# Patient Record
Sex: Male | Born: 1955 | Race: White | Hispanic: No | Marital: Married | Smoking: Never smoker
Health system: Southern US, Community
[De-identification: ages and names within clinical notes are randomized; demographics above are authoritative.]

## PROBLEM LIST (undated history)

## (undated) DIAGNOSIS — Z973 Presence of spectacles and contact lenses: Secondary | ICD-10-CM

## (undated) DIAGNOSIS — M1712 Unilateral primary osteoarthritis, left knee: Secondary | ICD-10-CM

## (undated) DIAGNOSIS — C801 Malignant (primary) neoplasm, unspecified: Secondary | ICD-10-CM

## (undated) DIAGNOSIS — I10 Essential (primary) hypertension: Secondary | ICD-10-CM

## (undated) DIAGNOSIS — E669 Obesity, unspecified: Secondary | ICD-10-CM

## (undated) HISTORY — DX: Unilateral primary osteoarthritis, left knee: M17.12

## (undated) HISTORY — DX: Obesity, unspecified: E66.9

## (undated) HISTORY — DX: Essential (primary) hypertension: I10

## (undated) HISTORY — PX: EYE SURGERY: SHX253

## (undated) HISTORY — PX: JOINT REPLACEMENT: SHX530

---
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
## 1982-12-19 HISTORY — PX: FINGER SURGERY: SHX640

## 1985-12-19 HISTORY — PX: KNEE ARTHROSCOPY WITH MEDIAL MENISECTOMY: SHX5651

## 1997-12-19 HISTORY — PX: CORNEAL TRANSPLANT: SHX108

## 2005-06-02 ENCOUNTER — Ambulatory Visit (HOSPITAL_COMMUNITY): Admission: RE | Admit: 2005-06-02 | Discharge: 2005-06-02 | Payer: Self-pay | Admitting: Orthopedic Surgery

## 2005-06-02 ENCOUNTER — Ambulatory Visit (HOSPITAL_BASED_OUTPATIENT_CLINIC_OR_DEPARTMENT_OTHER): Admission: RE | Admit: 2005-06-02 | Discharge: 2005-06-02 | Payer: Self-pay | Admitting: Orthopedic Surgery

## 2006-12-19 HISTORY — PX: NECK MASS EXCISION: SHX2079

## 2011-01-10 ENCOUNTER — Encounter: Payer: Self-pay | Admitting: Internal Medicine

## 2012-11-19 ENCOUNTER — Encounter: Payer: Self-pay | Admitting: Physician Assistant

## 2012-11-19 ENCOUNTER — Other Ambulatory Visit: Payer: Self-pay | Admitting: Physician Assistant

## 2012-11-19 DIAGNOSIS — E669 Obesity, unspecified: Secondary | ICD-10-CM

## 2012-11-19 DIAGNOSIS — M1712 Unilateral primary osteoarthritis, left knee: Secondary | ICD-10-CM

## 2012-11-19 DIAGNOSIS — I10 Essential (primary) hypertension: Secondary | ICD-10-CM

## 2012-11-19 NOTE — H&P (Signed)
TOTAL KNEE ADMISSION H&P  Patient is being admitted for left total knee arthroplasty.  Subjective:  Chief Complaint:left knee pain.  HPI: Louis Vasquez, 56 y.o. male, has a history of pain and functional disability in the left knee due to arthritis and has failed non-surgical conservative treatments for greater than 12 weeks to includeNSAID's and/or analgesics, corticosteriod injections, viscosupplementation injections, flexibility and strengthening excercises, supervised PT with diminished ADL's post treatment, weight reduction as appropriate and activity modification.  Onset of symptoms was gradual, starting >10 years ago with gradually worsening course since that time. The patient noted prior procedures on the knee to include  menisectomy on the left knee(s).  Patient currently rates pain in the left knee(s) at 9 out of 10 with activity. Patient has worsening of pain with activity and weight bearing, pain that interferes with activities of daily living, crepitus and joint swelling.  Patient has evidence of subchondral sclerosis, periarticular osteophytes, joint subluxation and joint space narrowing by imaging studies.  There is no active infection.  Patient Active Problem List   Diagnosis Date Noted  . Hypertension   . Obesity (BMI 30-39.9)   . Left knee DJD    Past Medical History  Diagnosis Date  . Hypertension   . Obesity (BMI 30-39.9)   . Left knee DJD     Past Surgical History  Procedure Date  . Menisectomy 1973    left knee  . Knee arthroscopy with medial menisectomy 1987    right  . Finger surgery 1984    enchondroma  . Neck mass excision 2008    lipoma  . Corneal transplant 1999     (Not in a hospital admission) No Known Allergies   Current Outpatient Prescriptions on File Prior to Visit  Medication Sig Dispense Refill  . valsartan (DIOVAN) 160 MG tablet Take 160 mg by mouth daily.         History  Substance Use Topics  . Smoking status: Never Smoker   .  Smokeless tobacco: Not on file  . Alcohol Use: Yes     Comment: social    Family History  Problem Relation Age of Onset  . Heart attack Maternal Grandfather   . Cancer - Other Paternal Grandfather     pancreatic     Review of Systems  Constitutional: Negative.   HENT: Negative.   Eyes: Negative.   Respiratory: Negative.   Cardiovascular: Negative.   Gastrointestinal: Negative.   Genitourinary: Negative.   Musculoskeletal: Positive for joint pain.  Skin: Negative.   Neurological: Negative.   Endo/Heme/Allergies: Negative.   Psychiatric/Behavioral: Negative.     Objective:  Physical Exam  Constitutional: He is oriented to person, place, and time. He appears well-developed and well-nourished.  HENT:  Head: Normocephalic and atraumatic.  Eyes: Pupils are equal, round, and reactive to light.  Neck: Normal range of motion.  Cardiovascular: Normal rate and regular rhythm.   Respiratory: Effort normal and breath sounds normal.  GI: Soft. Bowel sounds are normal.  Genitourinary:       Not pertinent to current symptomatology therefore not examined.  Musculoskeletal:       Examination of the left knee reveals a significant varus deformity.  1+ synovitis.  Range of motion from -5 to 125 degrees.  Knee is stable with normal patella tracking.  Examination of the right knee reveals 1+ crepitation.  1+ synovitis.  Range of motion -5 to 125 degrees.  Knee is stable with normal patella tracking.  Vascular exam: Pulses   are 2+ and symmetric.    Neurological: He is alert and oriented to person, place, and time.  Skin: Skin is warm and dry.  Psychiatric: He has a normal mood and affect. His behavior is normal. Thought content normal.    Vital signs in last 24 hours: Last recorded: 12/02 1700   BP: 167/100    Temp: 98.8 F (37.1 C)    Height: 5' 9" (1.753 m) SpO2: 98  Weight: 117.935 kg (260 lb)     Labs:   Estimated Body mass index is 38.40 kg/(m^2) as calculated from the  following:   Height as of this encounter: 5' 9"(1.753 m).   Weight as of this encounter: 260 lb(117.935 kg).   Imaging Review Plain radiographs demonstrate severe degenerative joint disease of the left knee(s). The overall alignment issignificant varus. The bone quality appears to be good for age and reported activity level.  Assessment/Plan:  End stage arthritis, left knee   The patient history, physical examination, clinical judgment of the provider and imaging studies are consistent with end stage degenerative joint disease of the left knee(s) and total knee arthroplasty is deemed medically necessary. The treatment options including medical management, injection therapy arthroscopy and arthroplasty were discussed at length. The risks and benefits of total knee arthroplasty were presented and reviewed. The risks due to aseptic loosening, infection, stiffness, patella tracking problems, thromboembolic complications and other imponderables were discussed. The patient acknowledged the explanation, agreed to proceed with the plan and consent was signed. Patient is being admitted for inpatient treatment for surgery, pain control, PT, OT, prophylactic antibiotics, VTE prophylaxis, progressive ambulation and ADL's and discharge planning. The patient is planning to be discharged home with home health services   

## 2012-11-26 ENCOUNTER — Encounter (HOSPITAL_COMMUNITY): Payer: Self-pay

## 2012-11-26 ENCOUNTER — Encounter (HOSPITAL_COMMUNITY)
Admission: RE | Admit: 2012-11-26 | Discharge: 2012-11-26 | Disposition: A | Discharge status: Home / Self Care | Payer: BC Managed Care – PPO | Source: Ambulatory Visit | Attending: Orthopedic Surgery | Admitting: Orthopedic Surgery

## 2012-11-26 ENCOUNTER — Ambulatory Visit (HOSPITAL_COMMUNITY)
Admission: RE | Admit: 2012-11-26 | Discharge: 2012-11-26 | Disposition: A | Discharge status: Home / Self Care | Payer: BC Managed Care – PPO | Source: Ambulatory Visit | Attending: Physician Assistant | Admitting: Physician Assistant

## 2012-11-26 DIAGNOSIS — Z01811 Encounter for preprocedural respiratory examination: Secondary | ICD-10-CM | POA: Insufficient documentation

## 2012-11-26 LAB — URINALYSIS, ROUTINE W REFLEX MICROSCOPIC
Bilirubin Urine: NEGATIVE
Glucose, UA: NEGATIVE mg/dL
Hgb urine dipstick: NEGATIVE
Ketones, ur: NEGATIVE mg/dL
Leukocytes, UA: NEGATIVE
Nitrite: NEGATIVE
Protein, ur: NEGATIVE mg/dL
Specific Gravity, Urine: 1.008 (ref 1.005–1.030)
Urobilinogen, UA: 0.2 mg/dL (ref 0.0–1.0)
pH: 6.5 (ref 5.0–8.0)

## 2012-11-26 LAB — COMPREHENSIVE METABOLIC PANEL
ALT: 27 U/L (ref 0–53)
AST: 27 U/L (ref 0–37)
Albumin: 4.3 g/dL (ref 3.5–5.2)
Alkaline Phosphatase: 60 U/L (ref 39–117)
BUN: 10 mg/dL (ref 6–23)
CO2: 29 mEq/L (ref 19–32)
Calcium: 9.4 mg/dL (ref 8.4–10.5)
Chloride: 103 mEq/L (ref 96–112)
Creatinine, Ser: 0.82 mg/dL (ref 0.50–1.35)
GFR calc Af Amer: >90 mL/min (ref >90)
GFR calc non Af Amer: >90 mL/min (ref >90)
Glucose, Bld: 105 mg/dL — ABNORMAL HIGH (ref 70–99)
Potassium: 3.6 mEq/L (ref 3.5–5.1)
Sodium: 141 mEq/L (ref 135–145)
Total Bilirubin: 1.2 mg/dL (ref 0.3–1.2)
Total Protein: 7.1 g/dL (ref 6.0–8.3)

## 2012-11-26 LAB — CBC WITH DIFFERENTIAL/PLATELET
Basophils Absolute: 0.1 10*3/uL (ref 0.0–0.1)
Basophils Relative: 1 % (ref 0–1)
Eosinophils Absolute: 0.2 10*3/uL (ref 0.0–0.7)
Eosinophils Relative: 2 % (ref 0–5)
HCT: 45.7 % (ref 39.0–52.0)
Hemoglobin: 15.9 g/dL (ref 13.0–17.0)
Lymphocytes Relative: 23 % (ref 12–46)
Lymphs Abs: 1.5 10*3/uL (ref 0.7–4.0)
MCH: 32.7 pg (ref 26.0–34.0)
MCHC: 34.8 g/dL (ref 30.0–36.0)
MCV: 94 fL (ref 78.0–100.0)
Monocytes Absolute: 0.5 10*3/uL (ref 0.1–1.0)
Monocytes Relative: 7 % (ref 3–12)
Neutro Abs: 4.5 10*3/uL (ref 1.7–7.7)
Neutrophils Relative %: 67 % (ref 43–77)
Platelets: 152 10*3/uL (ref 150–400)
RBC: 4.86 MIL/uL (ref 4.22–5.81)
RDW: 13.3 % (ref 11.5–15.5)
WBC: 6.8 10*3/uL (ref 4.0–10.5)

## 2012-11-26 LAB — SURGICAL PCR SCREEN
MRSA, PCR: NEGATIVE
Staphylococcus aureus: NEGATIVE

## 2012-11-26 LAB — TYPE AND SCREEN
ABO/RH(D): A POS
Antibody Screen: NEGATIVE

## 2012-11-26 LAB — PROTIME-INR
INR: 1.05 (ref 0.00–1.49)
Prothrombin Time: 13.6 seconds (ref 11.6–15.2)

## 2012-11-26 LAB — APTT: aPTT: 27 seconds (ref 24–37)

## 2012-11-26 LAB — ABO/RH: ABO/RH(D): A POS

## 2012-11-26 NOTE — Pre-Procedure Instructions (Addendum)
20 SHOLOM DULUDE  11/26/2012   Your procedure is scheduled on:  12/03/12  Report to [facility] a 630 AM.  Call this number if you have problems the morning of surgery: [phone]   Remember:   Do not eat foodor drink:After Midnight.    Take these medicines the morning of surgery with A SIP OF WATER:tylenol   Do not wear jewelry, make-up or nail polish.  Do not wear lotions, powders, or perfumes. You may wear deodorant.  Do not shave 48 hours prior to surgery. Men may shave face and neck.  Do not bring valuables to the hospital.  Contacts, dentures or bridgework may not be worn into surgery.  Leave suitcase in the car. After surgery it may be brought to your room.  For patients admitted to the hospital, checkout time is 11:00 AM the day of discharge.   Patients discharged the day of surgery will not be allowed to drive home.  Name and phone number of your driver susan [phone]  Special Instructions: Incentive Spirometry - Practice and bring it with you on the day of surgery. Shower using CHG 2 nights before surgery and the night before surgery.  If you shower the day of surgery use CHG.  Use special wash - you have one bottle of CHG for all showers.  You should use approximately 1/3 of the bottle for each shower.   Please read over the following fact sheets that you were given: Pain Booklet, Coughing and Deep Breathing, Blood Transfusion Information, Total Joint Packet, MRSA Information and Surgical Site Infection Prevention

## 2012-11-26 NOTE — Progress Notes (Signed)
11/26/12 1056  OBSTRUCTIVE SLEEP APNEA  Score 4 or greater  Results sent to PCP

## 2012-11-27 LAB — URINE CULTURE
Colony Count: NO GROWTH
Culture: NO GROWTH

## 2012-11-30 NOTE — Progress Notes (Signed)
Pt notified of time change;to arrive at 0745 

## 2012-12-02 MED ORDER — CHLORHEXIDINE GLUCONATE 4 % EX LIQD: 60.0000 mL | Freq: Once | CUTANEOUS | Status: DC

## 2012-12-02 MED ORDER — LACTATED RINGERS IV SOLN
INTRAVENOUS | Status: DC
  Administered 2012-12-03: 09:00:00 via INTRAVENOUS

## 2012-12-02 MED ORDER — POVIDONE-IODINE 7.5 % EX SOLN
Freq: Once | CUTANEOUS | Status: DC
  Filled 2012-12-02: qty 118

## 2012-12-02 MED ORDER — DEXTROSE 5 % IV SOLN
3.0000 g | INTRAVENOUS | Status: AC
  Administered 2012-12-03: 3 g via INTRAVENOUS
  Filled 2012-12-02: qty 3000

## 2012-12-03 ENCOUNTER — Encounter (HOSPITAL_COMMUNITY): Payer: Self-pay | Admitting: Anesthesiology

## 2012-12-03 ENCOUNTER — Encounter (HOSPITAL_COMMUNITY)
Admission: RE | Disposition: A | Discharge status: Home / Self Care | Payer: Self-pay | Source: Ambulatory Visit | Attending: Orthopedic Surgery

## 2012-12-03 ENCOUNTER — Encounter (HOSPITAL_COMMUNITY): Payer: Self-pay | Admitting: *Deleted

## 2012-12-03 ENCOUNTER — Inpatient Hospital Stay (HOSPITAL_COMMUNITY)
Admission: RE | Admit: 2012-12-03 | Discharge: 2012-12-04 | DRG: 209 | Disposition: A | Discharge status: Home / Self Care | Payer: BC Managed Care – PPO | Source: Ambulatory Visit | Attending: Orthopedic Surgery | Admitting: Orthopedic Surgery

## 2012-12-03 ENCOUNTER — Ambulatory Visit (HOSPITAL_COMMUNITY): Payer: BC Managed Care – PPO | Admitting: Anesthesiology

## 2012-12-03 DIAGNOSIS — I10 Essential (primary) hypertension: Secondary | ICD-10-CM | POA: Diagnosis present

## 2012-12-03 DIAGNOSIS — M1710 Unilateral primary osteoarthritis, unspecified knee: Principal | ICD-10-CM | POA: Diagnosis present

## 2012-12-03 DIAGNOSIS — M1712 Unilateral primary osteoarthritis, left knee: Secondary | ICD-10-CM

## 2012-12-03 DIAGNOSIS — E669 Obesity, unspecified: Secondary | ICD-10-CM | POA: Diagnosis present

## 2012-12-03 HISTORY — PX: TOTAL KNEE ARTHROPLASTY: SHX125

## 2012-12-03 SURGERY — ARTHROPLASTY, KNEE, TOTAL
Anesthesia: General | Site: Knee | Laterality: Left | Wound class: Clean

## 2012-12-03 SURGERY — Surgical Case
Anesthesia: *Unknown

## 2012-12-03 MED ORDER — HYDROMORPHONE HCL PF 1 MG/ML IJ SOLN
0.2500 mg | INTRAMUSCULAR | Status: DC | PRN
  Administered 2012-12-03 (×3): 0.5 mg via INTRAVENOUS

## 2012-12-03 MED ORDER — SODIUM CHLORIDE 0.9 % IR SOLN
Status: DC | PRN
  Administered 2012-12-03: 3000 mL
  Administered 2012-12-03: 1000 mL

## 2012-12-03 MED ORDER — BUPIVACAINE-EPINEPHRINE 0.25% -1:200000 IJ SOLN
INTRAMUSCULAR | Status: DC | PRN
  Administered 2012-12-03: 30 mL

## 2012-12-03 MED ORDER — ACETAMINOPHEN 325 MG PO TABS: 650.0000 mg | ORAL_TABLET | Freq: Four times a day (QID) | ORAL | Status: DC | PRN

## 2012-12-03 MED ORDER — ACETAMINOPHEN 10 MG/ML IV SOLN
1000.0000 mg | Freq: Once | INTRAVENOUS | Status: AC
  Administered 2012-12-03: 1000 mg via INTRAVENOUS
  Filled 2012-12-03: qty 100

## 2012-12-03 MED ORDER — BUPIVACAINE-EPINEPHRINE PF 0.25-1:200000 % IJ SOLN
INTRAMUSCULAR | Status: AC
  Filled 2012-12-03: qty 30

## 2012-12-03 MED ORDER — ALUM & MAG HYDROXIDE-SIMETH 200-200-20 MG/5ML PO SUSP: 30.0000 mL | ORAL | Status: DC | PRN

## 2012-12-03 MED ORDER — ONDANSETRON HCL 4 MG/2ML IJ SOLN
INTRAMUSCULAR | Status: DC | PRN
  Administered 2012-12-03: 4 mg via INTRAVENOUS

## 2012-12-03 MED ORDER — HYDROMORPHONE HCL PF 1 MG/ML IJ SOLN
INTRAMUSCULAR | Status: AC
  Filled 2012-12-03: qty 1

## 2012-12-03 MED ORDER — DEXAMETHASONE SODIUM PHOSPHATE 10 MG/ML IJ SOLN
INTRAMUSCULAR | Status: DC | PRN
  Administered 2012-12-03: 10 mg via INTRAVENOUS

## 2012-12-03 MED ORDER — ACETAMINOPHEN 650 MG RE SUPP: 650.0000 mg | Freq: Four times a day (QID) | RECTAL | Status: DC | PRN

## 2012-12-03 MED ORDER — CEFUROXIME SODIUM 1.5 G IJ SOLR
INTRAMUSCULAR | Status: DC | PRN
  Administered 2012-12-03: 1.5 g

## 2012-12-03 MED ORDER — OXYCODONE HCL 5 MG/5ML PO SOLN: 5.0000 mg | Freq: Once | ORAL | Status: DC | PRN

## 2012-12-03 MED ORDER — ACETAMINOPHEN 10 MG/ML IV SOLN
1000.0000 mg | Freq: Four times a day (QID) | INTRAVENOUS | Status: AC
  Administered 2012-12-03 – 2012-12-04 (×4): 1000 mg via INTRAVENOUS
  Filled 2012-12-03 (×4): qty 100

## 2012-12-03 MED ORDER — MENTHOL 3 MG MT LOZG: 1.0000 | LOZENGE | OROMUCOSAL | Status: DC | PRN

## 2012-12-03 MED ORDER — OXYCODONE HCL 5 MG PO TABS: 5.0000 mg | ORAL_TABLET | Freq: Once | ORAL | Status: DC | PRN

## 2012-12-03 MED ORDER — DOCUSATE SODIUM 100 MG PO CAPS
100.0000 mg | ORAL_CAPSULE | Freq: Two times a day (BID) | ORAL | Status: DC
  Administered 2012-12-03 – 2012-12-04 (×2): 100 mg via ORAL
  Filled 2012-12-03 (×2): qty 1

## 2012-12-03 MED ORDER — DEXAMETHASONE 6 MG PO TABS
10.0000 mg | ORAL_TABLET | Freq: Every day | ORAL | Status: DC
  Administered 2012-12-03 – 2012-12-04 (×2): 10 mg via ORAL
  Filled 2012-12-03 (×3): qty 1

## 2012-12-03 MED ORDER — ARTIFICIAL TEARS OP OINT
TOPICAL_OINTMENT | OPHTHALMIC | Status: DC | PRN
  Administered 2012-12-03: 1 via OPHTHALMIC

## 2012-12-03 MED ORDER — CEFAZOLIN SODIUM-DEXTROSE 2-3 GM-% IV SOLR
2.0000 g | Freq: Four times a day (QID) | INTRAVENOUS | Status: AC
Start: 2012-12-03 — End: 2012-12-04
  Administered 2012-12-03 – 2012-12-04 (×2): 2 g via INTRAVENOUS
  Filled 2012-12-03 (×2): qty 50

## 2012-12-03 MED ORDER — METOCLOPRAMIDE HCL 5 MG/ML IJ SOLN: 5.0000 mg | Freq: Three times a day (TID) | INTRAMUSCULAR | Status: DC | PRN

## 2012-12-03 MED ORDER — MIDAZOLAM HCL 2 MG/2ML IJ SOLN
0.5000 mg | INTRAMUSCULAR | Status: DC | PRN
  Administered 2012-12-03: 2 mg via INTRAVENOUS

## 2012-12-03 MED ORDER — FENTANYL CITRATE 0.05 MG/ML IJ SOLN
INTRAMUSCULAR | Status: DC | PRN
  Administered 2012-12-03: 100 ug via INTRAVENOUS

## 2012-12-03 MED ORDER — CEFUROXIME SODIUM 1.5 G IJ SOLR
INTRAMUSCULAR | Status: AC
  Filled 2012-12-03: qty 1.5

## 2012-12-03 MED ORDER — PHENOL 1.4 % MT LIQD: 1.0000 | OROMUCOSAL | Status: DC | PRN

## 2012-12-03 MED ORDER — ENOXAPARIN SODIUM 30 MG/0.3ML ~~LOC~~ SOLN
30.0000 mg | Freq: Two times a day (BID) | SUBCUTANEOUS | Status: DC
  Administered 2012-12-04: 30 mg via SUBCUTANEOUS
  Filled 2012-12-03 (×3): qty 0.3

## 2012-12-03 MED ORDER — ONDANSETRON HCL 4 MG/2ML IJ SOLN: 4.0000 mg | Freq: Four times a day (QID) | INTRAMUSCULAR | Status: DC | PRN

## 2012-12-03 MED ORDER — ONDANSETRON HCL 4 MG PO TABS: 4.0000 mg | ORAL_TABLET | Freq: Four times a day (QID) | ORAL | Status: DC | PRN

## 2012-12-03 MED ORDER — POTASSIUM CHLORIDE IN NACL 20-0.9 MEQ/L-% IV SOLN
INTRAVENOUS | Status: DC
  Administered 2012-12-03: 17:00:00 via INTRAVENOUS
  Filled 2012-12-03 (×6): qty 1000

## 2012-12-03 MED ORDER — ACETAMINOPHEN 10 MG/ML IV SOLN
INTRAVENOUS | Status: AC
  Filled 2012-12-03: qty 100

## 2012-12-03 MED ORDER — BUPIVACAINE-EPINEPHRINE PF 0.5-1:200000 % IJ SOLN
INTRAMUSCULAR | Status: DC | PRN
  Administered 2012-12-03: 30 mL

## 2012-12-03 MED ORDER — FENTANYL CITRATE 0.05 MG/ML IJ SOLN
50.0000 ug | INTRAMUSCULAR | Status: DC | PRN
  Administered 2012-12-03: 100 ug via INTRAVENOUS

## 2012-12-03 MED ORDER — ZOLPIDEM TARTRATE 5 MG PO TABS: 5.0000 mg | ORAL_TABLET | Freq: Every evening | ORAL | Status: DC | PRN

## 2012-12-03 MED ORDER — MIDAZOLAM HCL 2 MG/2ML IJ SOLN
INTRAMUSCULAR | Status: AC
  Filled 2012-12-03: qty 2

## 2012-12-03 MED ORDER — LACTATED RINGERS IV SOLN
INTRAVENOUS | Status: DC | PRN
  Administered 2012-12-03 (×2): via INTRAVENOUS

## 2012-12-03 MED ORDER — HYDROMORPHONE HCL PF 1 MG/ML IJ SOLN
0.5000 mg | INTRAMUSCULAR | Status: DC | PRN
  Administered 2012-12-04 (×2): 1 mg via INTRAVENOUS
  Filled 2012-12-03 (×2): qty 1

## 2012-12-03 MED ORDER — FENTANYL CITRATE 0.05 MG/ML IJ SOLN
INTRAMUSCULAR | Status: AC
  Filled 2012-12-03: qty 2

## 2012-12-03 MED ORDER — METOCLOPRAMIDE HCL 10 MG PO TABS: 5.0000 mg | ORAL_TABLET | Freq: Three times a day (TID) | ORAL | Status: DC | PRN

## 2012-12-03 MED ORDER — DEXAMETHASONE SODIUM PHOSPHATE 10 MG/ML IJ SOLN
10.0000 mg | Freq: Every day | INTRAMUSCULAR | Status: DC
  Filled 2012-12-03 (×2): qty 1

## 2012-12-03 MED ORDER — PROPOFOL 10 MG/ML IV BOLUS
INTRAVENOUS | Status: DC | PRN
  Administered 2012-12-03 (×3): 50 mg via INTRAVENOUS
  Administered 2012-12-03: 200 mg via INTRAVENOUS

## 2012-12-03 MED ORDER — CELECOXIB 200 MG PO CAPS
200.0000 mg | ORAL_CAPSULE | Freq: Two times a day (BID) | ORAL | Status: DC
  Administered 2012-12-03 – 2012-12-04 (×2): 200 mg via ORAL
  Filled 2012-12-03 (×3): qty 1

## 2012-12-03 MED ORDER — IRBESARTAN 150 MG PO TABS
150.0000 mg | ORAL_TABLET | Freq: Every day | ORAL | Status: DC
  Administered 2012-12-03 – 2012-12-04 (×2): 150 mg via ORAL
  Filled 2012-12-03 (×2): qty 1

## 2012-12-03 MED ORDER — OXYCODONE HCL 5 MG PO TABS
5.0000 mg | ORAL_TABLET | ORAL | Status: DC | PRN
  Administered 2012-12-04 (×2): 10 mg via ORAL
  Filled 2012-12-03 (×2): qty 2

## 2012-12-03 MED ORDER — LIDOCAINE HCL (CARDIAC) 20 MG/ML IV SOLN
INTRAVENOUS | Status: DC | PRN
  Administered 2012-12-03: 50 mg via INTRAVENOUS

## 2012-12-03 MED ORDER — DIPHENHYDRAMINE HCL 12.5 MG/5ML PO ELIX: 12.5000 mg | ORAL_SOLUTION | ORAL | Status: DC | PRN

## 2012-12-03 SURGICAL SUPPLY — 70 items
BANDAGE ESMARK 6X9 LF (GAUZE/BANDAGES/DRESSINGS) ×1 IMPLANT
BLADE SAGITTAL 25.0X1.19X90 (BLADE) ×2 IMPLANT
BLADE SAW SGTL 11.0X1.19X90.0M (BLADE) IMPLANT
BLADE SAW SGTL 13.0X1.19X90.0M (BLADE) ×2 IMPLANT
BLADE SURG 10 STRL SS (BLADE) ×4 IMPLANT
BNDG ELASTIC 6X15 VLCR STRL LF (GAUZE/BANDAGES/DRESSINGS) ×2 IMPLANT
BNDG ESMARK 6X9 LF (GAUZE/BANDAGES/DRESSINGS) ×2
BOWL SMART MIX CTS (DISPOSABLE) ×2 IMPLANT
CEMENT HV SMART SET (Cement) ×4 IMPLANT
CLOTH BEACON ORANGE TIMEOUT ST (SAFETY) ×2 IMPLANT
COVER BACK TABLE 24X17X13 BIG (DRAPES) IMPLANT
COVER PROBE W GEL 5X96 (DRAPES) IMPLANT
COVER SURGICAL LIGHT HANDLE (MISCELLANEOUS) ×2 IMPLANT
CUFF TOURNIQUET SINGLE 34IN LL (TOURNIQUET CUFF) ×2 IMPLANT
CUFF TOURNIQUET SINGLE 44IN (TOURNIQUET CUFF) IMPLANT
DRAPE EXTREMITY T 121X128X90 (DRAPE) ×2 IMPLANT
DRAPE INCISE IOBAN 66X45 STRL (DRAPES) ×2 IMPLANT
DRAPE PROXIMA HALF (DRAPES) ×2 IMPLANT
DRAPE U-SHAPE 47X51 STRL (DRAPES) ×2 IMPLANT
DRSG ADAPTIC 3X8 NADH LF (GAUZE/BANDAGES/DRESSINGS) ×2 IMPLANT
DRSG PAD ABDOMINAL 8X10 ST (GAUZE/BANDAGES/DRESSINGS) ×4 IMPLANT
DURAPREP 26ML APPLICATOR (WOUND CARE) ×2 IMPLANT
ELECT CAUTERY BLADE 6.4 (BLADE) ×2 IMPLANT
ELECT REM PT RETURN 9FT ADLT (ELECTROSURGICAL) ×2
ELECTRODE REM PT RTRN 9FT ADLT (ELECTROSURGICAL) ×1 IMPLANT
EVACUATOR 1/8 PVC DRAIN (DRAIN) ×2 IMPLANT
FACESHIELD LNG OPTICON STERILE (SAFETY) ×2 IMPLANT
GLOVE BIO SURGEON STRL SZ7 (GLOVE) ×4 IMPLANT
GLOVE BIO SURGEON STRL SZ7.5 (GLOVE) ×6 IMPLANT
GLOVE BIOGEL PI IND STRL 7.0 (GLOVE) ×1 IMPLANT
GLOVE BIOGEL PI IND STRL 7.5 (GLOVE) ×1 IMPLANT
GLOVE BIOGEL PI IND STRL 8 (GLOVE) ×1 IMPLANT
GLOVE BIOGEL PI INDICATOR 7.0 (GLOVE) ×1
GLOVE BIOGEL PI INDICATOR 7.5 (GLOVE) ×1
GLOVE BIOGEL PI INDICATOR 8 (GLOVE) ×1
GLOVE NEODERM STER SZ 7 (GLOVE) ×2 IMPLANT
GLOVE SS BIOGEL STRL SZ 7.5 (GLOVE) ×3 IMPLANT
GLOVE SUPERSENSE BIOGEL SZ 7.5 (GLOVE) ×3
GOWN PREVENTION PLUS XLARGE (GOWN DISPOSABLE) ×6 IMPLANT
GOWN STRL NON-REIN LRG LVL3 (GOWN DISPOSABLE) ×4 IMPLANT
HANDPIECE INTERPULSE COAX TIP (DISPOSABLE) ×1
HOOD PEEL AWAY FACE SHEILD DIS (HOOD) ×6 IMPLANT
IMMOBILIZER KNEE 22 UNIV (SOFTGOODS) ×4 IMPLANT
INSERT CUSHION PRONEVIEW LG (MISCELLANEOUS) IMPLANT
KIT BASIN OR (CUSTOM PROCEDURE TRAY) ×2 IMPLANT
KIT ROOM TURNOVER OR (KITS) ×2 IMPLANT
MANIFOLD NEPTUNE II (INSTRUMENTS) ×2 IMPLANT
NS IRRIG 1000ML POUR BTL (IV SOLUTION) ×2 IMPLANT
PACK TOTAL JOINT (CUSTOM PROCEDURE TRAY) ×2 IMPLANT
PAD ARMBOARD 7.5X6 YLW CONV (MISCELLANEOUS) ×4 IMPLANT
PAD CAST 4YDX4 CTTN HI CHSV (CAST SUPPLIES) ×1 IMPLANT
PADDING CAST COTTON 4X4 STRL (CAST SUPPLIES) ×1
PADDING CAST COTTON 6X4 STRL (CAST SUPPLIES) ×2 IMPLANT
POSITIONER HEAD PRONE TRACH (MISCELLANEOUS) ×2 IMPLANT
RUBBERBAND STERILE (MISCELLANEOUS) ×2 IMPLANT
SET HNDPC FAN SPRY TIP SCT (DISPOSABLE) ×1 IMPLANT
SPONGE GAUZE 4X4 12PLY (GAUZE/BANDAGES/DRESSINGS) ×2 IMPLANT
STRIP CLOSURE SKIN 1/2X4 (GAUZE/BANDAGES/DRESSINGS) ×4 IMPLANT
SUCTION FRAZIER TIP 10 FR DISP (SUCTIONS) ×2 IMPLANT
SUT ETHIBOND NAB CT1 #1 30IN (SUTURE) ×2 IMPLANT
SUT MNCRL AB 3-0 PS2 18 (SUTURE) ×2 IMPLANT
SUT VIC AB 0 CT1 27 (SUTURE) ×2
SUT VIC AB 0 CT1 27XBRD ANBCTR (SUTURE) ×2 IMPLANT
SUT VIC AB 2-0 CT1 27 (SUTURE) ×2
SUT VIC AB 2-0 CT1 TAPERPNT 27 (SUTURE) ×2 IMPLANT
SYR 30ML SLIP (SYRINGE) ×2 IMPLANT
TOWEL OR 17X24 6PK STRL BLUE (TOWEL DISPOSABLE) ×2 IMPLANT
TOWEL OR 17X26 10 PK STRL BLUE (TOWEL DISPOSABLE) ×2 IMPLANT
TRAY FOLEY CATH 14FR (SET/KITS/TRAYS/PACK) ×2 IMPLANT
WATER STERILE IRR 1000ML POUR (IV SOLUTION) ×4 IMPLANT

## 2012-12-03 NOTE — Preoperative (Signed)
Beta Blockers   Reason not to administer Beta Blockers:Not Applicable 

## 2012-12-03 NOTE — Progress Notes (Signed)
Orthopedic Tech Progress Note Patient Details:  Louis Vasquez 15-Jun-1956 [id number] CPM applied to Left LE with appropriate settings. No OHF available at this time.  CPM Left Knee CPM Left Knee: On Left Knee Flexion (Degrees): 60  Left Knee Extension (Degrees): 0    [name] 12/03/2012, 1:23 PM

## 2012-12-03 NOTE — Op Note (Signed)
MRN:     [id number] DOB/AGE:    08/04/1956 / 56 y.o.       OPERATIVE REPORT    DATE OF PROCEDURE:  12/03/2012       PREOPERATIVE DIAGNOSIS:   DJD LEFT KNEE      There is no height or weight on file to calculate BMI.                                                        POSTOPERATIVE DIAGNOSIS:   DJD LEFT KNEE                                                                      PROCEDURE:  Procedure(s): TOTAL KNEE ARTHROPLASTY Using Depuy Sigma RP implants #5 Femur, #5Tibia, 12.61mm sigma RP bearing, 35 Patella     SURGEON: Aishi Courts Vasquez    ASSISTANT:  Gean Birchwood MD, Kirstin Shepperson PA-C   (Present and scrubbed throughout the case, critical for assistance with exposure, retraction, instrumentation, and closure.)         ANESTHESIA: GET with Femoral Nerve Block  DRAINS: foley, 2 medium hemovac in knee   TOURNIQUET TIME:   COMPLICATIONS:  None     SPECIMENS: None   INDICATIONS FOR PROCEDURE: The patient has  DJD LEFT KNEE, varus deformities, XR shows bone on bone arthritis. Patient has failed all conservative measures including anti-inflammatory medicines, narcotics, attempts at  exercise and weight loss, cortisone injections and viscosupplementation.  Risks and benefits of surgery have been discussed, questions answered.   DESCRIPTION OF PROCEDURE: The patient identified by armband, received  right femoral nerve block and IV antibiotics, in the holding area at [facility]. Patient taken to the operating room, appropriate anesthetic  monitors were attached General endotracheal anesthesia induced with  the patient in supine position, Foley catheter was inserted. Tourniquet  applied high to the operative thigh. Lateral post and foot positioner  applied to the table, the lower extremity was then prepped and draped  in usual sterile fashion from the ankle to the tourniquet. Time-out procedure was performed. The limb was wrapped with an Esmarch bandage and the tourniquet  inflated to 365 mmHg. We began the operation by making the anterior midline incision starting at handbreadth above the patella going over the patella 1 cm medial to and  4 cm distal to the tibial tubercle. Small bleeders in the skin and the  subcutaneous tissue identified and cauterized. Transverse retinaculum was incised and reflected medially and Vasquez medial parapatellar arthrotomy was accomplished. the patella was everted and theprepatellar fat pad resected. The superficial medial collateral  ligament was then elevated from anterior to posterior along the proximal  flare of the tibia and anterior half of the menisci resected. Vasquez very aggressive release needed to be performed due to the severe deformity and the need for osteophyte excision on the proximal tibia and distal femur. The knee was hyperflexed exposing bone on bone arthritis. Peripheral and notch osteophytes as well as the cruciate ligaments were then resected. We continued to  work our way around posteriorly  along the proximal tibia, and externally  rotated the tibia subluxing it out from underneath the femur. Vasquez McHale  retractor was placed through the notch and Vasquez lateral Hohmann retractor  placed, and we then drilled through the proximal tibia in line with the  axis of the tibia followed by an intramedullary guide rod and 2-degree  posterior slope cutting guide. The tibial cutting guide was pinned into place  allowing resection of 2 mm of bone medially and about 7 mm of bone  laterally because of her varus deformity. Satisfied with the tibial resection, we then  entered the distal femur 2 mm anterior to the PCL origin with the  intramedullary guide rod and applied the distal femoral cutting guide  set at 11mm, with 5 degrees of valgus. This was pinned along the  epicondylar axis. At this point, the distal femoral cut was accomplished without difficulty. We then sized for Vasquez #5 femoral component and pinned the guide in 3 degrees of external  rotation.The chamfer cutting guide was pinned into place. The anterior, posterior, and chamfer cuts were accomplished without difficulty followed by  the Sigma RP box cutting guide and the box cut. We also removed posterior osteophytes from the posterior femoral condyles. At this  time, the knee was brought into full extension. We checked our  extension and flexion gaps and found them symmetric at 12.6mm.  The patella thickness measured at 25 mm. We set the cutting guide at 15 and removed the posterior 9.5-10 mm  of the patella sized for 35 button and drilled the lollipop. The knee  was then once again hyperflexed exposing the proximal tibia. We sized for Vasquez #5 tibial base plate, applied the smokestack and the conical reamer followed by the the Delta fin keel punch. We then hammered into place the Sigma RP trial femoral component, inserted Vasquez 12.5-mm trial bearing, trial patellar button, and took the knee through range of motion from 0-130 degrees. No thumb pressure was required for patellar  tracking. At this point, all trial components were removed, Vasquez double batch of DePuy HV cement with 1500 mg of Zinacef was mixed and applied to all bony metallic mating surfaces except for the posterior condyles of the femur itself. In order, we  hammered into place the tibial tray and removed excess cement, the femoral component and removed excess cement, Vasquez 12.5-mm Sigma RP bearing  was inserted, and the knee brought to full extension with compression.  The patellar button was clamped into place, and excess cement  removed. While the cement cured the wound was irrigated out with normal saline solution pulse lavage, and medium Hemovac drains were placed.. Ligament stability and patellar tracking were checked and found to be excellent. The tourniquet was then released and hemostasis was obtained with cautery. The parapatellar arthrotomy was closed with  #1 ethibond suture. The subcutaneous tissue with 0 and 2-0 undyed   Vicryl suture, and 4-0 Monocryl.. Vasquez dressing of Xeroform,  4 x 4, dressing sponges, Webril, and Ace wrap applied. Needle and sponge count were correct times 2.The patient awakened, extubated, and taken to recovery room without difficulty. Vascular status was normal, pulses 2+ and symmetric.   Louis Vasquez 12/03/2012, 11:31 AM

## 2012-12-03 NOTE — Anesthesia Procedure Notes (Signed)
Anesthesia Regional Block:  Femoral nerve block  Pre-Anesthetic Checklist: ,, timeout performed, Correct Patient, Correct Site, Correct Laterality, Correct Procedure, Correct Position, site marked, Risks and benefits discussed, pre-op evaluation,  At surgeon's request and post-op pain management  Laterality: Left  Prep: Maximum Sterile Barrier Precautions used and chloraprep       Needles:  Injection technique: Single-shot  Needle Type: Echogenic Stimulator Needle     Needle Length: 5cm 5 cm Needle Gauge: 22 and 22 G    Additional Needles:  Procedures: ultrasound guided (picture in chart) and nerve stimulator Femoral nerve block  Nerve Stimulator or Paresthesia:  Response: Patellar respose, 0.4 mA,   Additional Responses:   Narrative:  Start time: 12/03/2012 8:50 AM End time: 12/03/2012 9:00 AM Injection made incrementally with aspirations every 5 mL. Anesthesiologist: Jazlynn Nemetz,MD  Additional Notes: 2% Lidocaine skin wheel.   Femoral nerve block

## 2012-12-03 NOTE — Interval H&P Note (Signed)
History and Physical Interval Note:  12/03/2012 8:57 AM  Louis Vasquez  has presented today for surgery, with the diagnosis of DJD LEFT KNEE  The various methods of treatment have been discussed with the patient and family. After consideration of risks, benefits and other options for treatment, the patient has consented to  Procedure(s) (LRB) with comments: TOTAL KNEE ARTHROPLASTY (Left) - Left Knee Arthroplasty Lateral and Medial Compartments with patella Resurfacing as a surgical intervention .  The patient's history has been reviewed, patient examined, no change in status, stable for surgery.  I have reviewed the patient's chart and labs.  Questions were answered to the patient's satisfaction.     Salvatore Marvel A

## 2012-12-03 NOTE — Progress Notes (Signed)
Orthopedic Tech Progress Note Patient Details:  Louis Vasquez [birth year] [id number] Additional note: Patient has Knee immobilizer; issued after surgery  Patient ID: [name], male   DOB: 18-Jan-1956, 56 y.o.   MRN: [id number]   Orie Rout 12/03/2012, 1:24 PM

## 2012-12-03 NOTE — Transfer of Care (Signed)
Immediate Anesthesia Transfer of Care Note  Patient: Louis Vasquez  Procedure(s) Performed: Procedure(s) (LRB) with comments: TOTAL KNEE ARTHROPLASTY (Left) - Left Knee Arthroplasty Lateral and Medial Compartments with patella Resurfacing  Patient Location: PACU  Anesthesia Type:General  Level of Consciousness: awake and confused  Airway & Oxygen Therapy: Patient Spontanous Breathing and Patient connected to nasal cannula oxygen  Post-op Assessment: Report given to PACU RN, Post -op Vital signs reviewed and stable and Patient moving all extremities  Post vital signs: Reviewed and stable  Complications: No apparent anesthesia complications

## 2012-12-03 NOTE — H&P (View-Only) (Signed)
TOTAL KNEE ADMISSION H&P  Patient is being admitted for left total knee arthroplasty.  Subjective:  Chief Complaint:left knee pain.  HPI: Louis Vasquez, 56 y.o. male, has a history of pain and functional disability in the left knee due to arthritis and has failed non-surgical conservative treatments for greater than 12 weeks to includeNSAID's and/or analgesics, corticosteriod injections, viscosupplementation injections, flexibility and strengthening excercises, supervised PT with diminished ADL's post treatment, weight reduction as appropriate and activity modification.  Onset of symptoms was gradual, starting >10 years ago with gradually worsening course since that time. The patient noted prior procedures on the knee to include  menisectomy on the left knee(s).  Patient currently rates pain in the left knee(s) at 9 out of 10 with activity. Patient has worsening of pain with activity and weight bearing, pain that interferes with activities of daily living, crepitus and joint swelling.  Patient has evidence of subchondral sclerosis, periarticular osteophytes, joint subluxation and joint space narrowing by imaging studies.  There is no active infection.  Patient Active Problem List   Diagnosis Date Noted  . Hypertension   . Obesity (BMI 30-39.9)   . Left knee DJD    Past Medical History  Diagnosis Date  . Hypertension   . Obesity (BMI 30-39.9)   . Left knee DJD     Past Surgical History  Procedure Date  . Menisectomy 1973    left knee  . Knee arthroscopy with medial menisectomy 1987    right  . Finger surgery 1984    enchondroma  . Neck mass excision 2008    lipoma  . Corneal transplant 1999     (Not in a hospital admission) No Known Allergies   Current Outpatient Prescriptions on File Prior to Visit  Medication Sig Dispense Refill  . valsartan (DIOVAN) 160 MG tablet Take 160 mg by mouth daily.         History  Substance Use Topics  . Smoking status: Never Smoker   .  Smokeless tobacco: Not on file  . Alcohol Use: Yes     Comment: social    Family History  Problem Relation Age of Onset  . Heart attack Maternal Grandfather   . Cancer - Other Paternal Grandfather     pancreatic     Review of Systems  Constitutional: Negative.   HENT: Negative.   Eyes: Negative.   Respiratory: Negative.   Cardiovascular: Negative.   Gastrointestinal: Negative.   Genitourinary: Negative.   Musculoskeletal: Positive for joint pain.  Skin: Negative.   Neurological: Negative.   Endo/Heme/Allergies: Negative.   Psychiatric/Behavioral: Negative.     Objective:  Physical Exam  Constitutional: He is oriented to person, place, and time. He appears well-developed and well-nourished.  HENT:  Head: Normocephalic and atraumatic.  Eyes: Pupils are equal, round, and reactive to light.  Neck: Normal range of motion.  Cardiovascular: Normal rate and regular rhythm.   Respiratory: Effort normal and breath sounds normal.  GI: Soft. Bowel sounds are normal.  Genitourinary:       Not pertinent to current symptomatology therefore not examined.  Musculoskeletal:       Examination of the left knee reveals a significant varus deformity.  1+ synovitis.  Range of motion from -5 to 125 degrees.  Knee is stable with normal patella tracking.  Examination of the right knee reveals 1+ crepitation.  1+ synovitis.  Range of motion -5 to 125 degrees.  Knee is stable with normal patella tracking.  Vascular exam: Pulses  are 2+ and symmetric.    Neurological: He is alert and oriented to person, place, and time.  Skin: Skin is warm and dry.  Psychiatric: He has a normal mood and affect. His behavior is normal. Thought content normal.    Vital signs in last 24 hours: Last recorded: 12/02 1700   BP: 167/100    Temp: 98.8 F (37.1 C)    Height: 5\' 9"  (1.753 m) SpO2: 98  Weight: 117.935 kg (260 lb)     Labs:   Estimated Body mass index is 38.40 kg/(m^2) as calculated from the  following:   Height as of this encounter: 5\' 9" (1.753 m).   Weight as of this encounter: 260 lb(117.935 kg).   Imaging Review Plain radiographs demonstrate severe degenerative joint disease of the left knee(s). The overall alignment issignificant varus. The bone quality appears to be good for age and reported activity level.  Assessment/Plan:  End stage arthritis, left knee   The patient history, physical examination, clinical judgment of the provider and imaging studies are consistent with end stage degenerative joint disease of the left knee(s) and total knee arthroplasty is deemed medically necessary. The treatment options including medical management, injection therapy arthroscopy and arthroplasty were discussed at length. The risks and benefits of total knee arthroplasty were presented and reviewed. The risks due to aseptic loosening, infection, stiffness, patella tracking problems, thromboembolic complications and other imponderables were discussed. The patient acknowledged the explanation, agreed to proceed with the plan and consent was signed. Patient is being admitted for inpatient treatment for surgery, pain control, PT, OT, prophylactic antibiotics, VTE prophylaxis, progressive ambulation and ADL's and discharge planning. The patient is planning to be discharged home with home health services

## 2012-12-03 NOTE — Anesthesia Postprocedure Evaluation (Signed)
  Anesthesia Post-op Note  Patient: Louis Vasquez  Procedure(s) Performed: Procedure(s) (LRB) with comments: TOTAL KNEE ARTHROPLASTY (Left) - Left Knee Arthroplasty Lateral and Medial Compartments with patella Resurfacing  Patient Location: PACU  Anesthesia Type:GA combined with regional for post-op pain  Level of Consciousness: awake, alert  and oriented  Airway and Oxygen Therapy: Patient Spontanous Breathing  Post-op Pain: moderate  Post-op Assessment: Post-op Vital signs reviewed, Patient's Cardiovascular Status Stable, Respiratory Function Stable, Patent Airway and No signs of Nausea or vomiting  Post-op Vital Signs: Reviewed and stable  Complications: No apparent anesthesia complications

## 2012-12-03 NOTE — Anesthesia Preprocedure Evaluation (Signed)
Anesthesia Evaluation  Patient identified by MRN, date of birth, ID band Patient awake    Reviewed: Allergy & Precautions, H&P , NPO status , Patient's Chart, lab work & pertinent test results  Airway Mallampati: II TM Distance: >3 FB Neck ROM: Full    Dental No notable dental hx. (+) Teeth Intact and Dental Advisory Given   Pulmonary neg pulmonary ROS,  breath sounds clear to auscultation  Pulmonary exam normal       Cardiovascular hypertension, On Medications Rhythm:Regular Rate:Normal     Neuro/Psych negative neurological ROS  negative psych ROS   GI/Hepatic negative GI ROS, Neg liver ROS,   Endo/Other  negative endocrine ROS  Renal/GU negative Renal ROS  negative genitourinary   Musculoskeletal   Abdominal   Peds  Hematology negative hematology ROS (+)   Anesthesia Other Findings   Reproductive/Obstetrics negative OB ROS                           Anesthesia Physical Anesthesia Plan  ASA: II  Anesthesia Plan: General and Regional   Post-op Pain Management:    Induction: Intravenous  Airway Management Planned: LMA  Additional Equipment:   Intra-op Plan:   Post-operative Plan: Extubation in OR  Informed Consent: I have reviewed the patients History and Physical, chart, labs and discussed the procedure including the risks, benefits and alternatives for the proposed anesthesia with the patient or authorized representative who has indicated his/her understanding and acceptance.   Dental advisory given  Plan Discussed with: CRNA and Surgeon  Anesthesia Plan Comments:         Anesthesia Quick Evaluation

## 2012-12-04 ENCOUNTER — Encounter (HOSPITAL_COMMUNITY): Payer: Self-pay | Admitting: Orthopedic Surgery

## 2012-12-04 LAB — BASIC METABOLIC PANEL
BUN: 10 mg/dL (ref 6–23)
CO2: 25 mEq/L (ref 19–32)
Calcium: 8.6 mg/dL (ref 8.4–10.5)
Chloride: 104 mEq/L (ref 96–112)
Creatinine, Ser: 0.68 mg/dL (ref 0.50–1.35)
GFR calc Af Amer: >90 mL/min (ref >90)
GFR calc non Af Amer: >90 mL/min (ref >90)
Glucose, Bld: 149 mg/dL — ABNORMAL HIGH (ref 70–99)
Potassium: 3.8 mEq/L (ref 3.5–5.1)
Sodium: 142 mEq/L (ref 135–145)

## 2012-12-04 LAB — CBC
HCT: 38.2 % — ABNORMAL LOW (ref 39.0–52.0)
Hemoglobin: 12.9 g/dL — ABNORMAL LOW (ref 13.0–17.0)
MCH: 32.3 pg (ref 26.0–34.0)
MCHC: 33.8 g/dL (ref 30.0–36.0)
MCV: 95.5 fL (ref 78.0–100.0)
Platelets: 152 10*3/uL (ref 150–400)
RBC: 4 MIL/uL — ABNORMAL LOW (ref 4.22–5.81)
RDW: 13 % (ref 11.5–15.5)
WBC: 15.8 10*3/uL — ABNORMAL HIGH (ref 4.0–10.5)

## 2012-12-04 MED ORDER — ACETAMINOPHEN 500 MG PO TABS: 1000.0000 mg | ORAL_TABLET | Freq: Four times a day (QID) | ORAL | Status: DC | PRN

## 2012-12-04 MED ORDER — DSS 100 MG PO CAPS: ORAL_CAPSULE | ORAL | Status: DC

## 2012-12-04 MED ORDER — ENOXAPARIN SODIUM 30 MG/0.3ML ~~LOC~~ SOLN: 30.0000 mg | Freq: Two times a day (BID) | SUBCUTANEOUS | Status: DC

## 2012-12-04 MED ORDER — OXYCODONE HCL 5 MG PO TABS: ORAL_TABLET | ORAL | Status: DC

## 2012-12-04 MED ORDER — CELECOXIB 200 MG PO CAPS: ORAL_CAPSULE | ORAL | Status: AC

## 2012-12-04 NOTE — Evaluation (Signed)
Physical Therapy Evaluation Patient Details Name: Louis Vasquez MRN: [id number] DOB: 1956/03/15 Today's Date: 12/04/2012 Time: 0920-1027 PT Time Calculation (min): 67 min  PT Assessment / Plan / Recommendation Clinical Impression  Pt is a 56 y/o male s/p L TKA.  Pt will benefit from acute PT intervention to promote functional mobility for d/c to home.        PT Assessment  Patient needs continued PT services    Follow Up Recommendations  Home health PT    Barriers to Discharge None      Equipment Recommendations  None recommended by PT    Frequency 7X/week    Precautions / Restrictions Precautions Precautions: Knee Precaution Booklet Issued: No Precaution Comments: Reviewed knee positioning to promote extension with pt and spouse.  Required Braces or Orthoses: Knee Immobilizer - Left Knee Immobilizer - Left: On except when in CPM Restrictions Weight Bearing Restrictions: Yes LLE Weight Bearing: Weight bearing as tolerated   Pertinent Vitals/Pain 1/10 pain in L Knee at end of session. Pt medicated during session.       Mobility  Bed Mobility Bed Mobility: Supine to Sit;Sitting - Scoot to Edge of Bed;Sit to Supine Supine to Sit: 6: Modified independent (Device/Increase time) Sitting - Scoot to Edge of Bed: 6: Modified independent (Device/Increase time) Sit to Supine: 6: Modified independent (Device/Increase time) Details for Bed Mobility Assistance: instructed pt in energy saving technique.  Transfers Transfers: Sit to Stand;Stand to Sit Sit to Stand: 5: Supervision;From bed;From chair/3-in-1;With upper extremity assist Stand to Sit: 5: Supervision;To bed;To chair/3-in-1;With upper extremity assist Details for Transfer Assistance: Cues for hand and L LE placement.  Ambulation/Gait Ambulation/Gait Assistance: 5: Supervision Ambulation Distance (Feet): 100 Feet Assistive device: Rolling walker Ambulation/Gait Assistance Details: Cues for gait sequencing.  Gait  Pattern: Step-to pattern Stairs: Yes Stairs Assistance: 4: Min guard;4: Min assist Stairs Assistance Details (indicate cue type and reason): Cues for technique. Instructed spouse in supervision technique. Asssit to position and steady RW  Stair Management Technique: One rail Left;Sideways;With walker Number of Stairs: 14  Wheelchair Mobility Wheelchair Mobility: No    Exercises     PT Diagnosis: Acute pain  PT Problem List: Decreased strength;Decreased range of motion;Decreased activity tolerance;Decreased mobility PT Treatment Interventions: DME instruction;Gait training;Stair training;Functional mobility training;Therapeutic exercise;Therapeutic activities;Patient/family education   PT Goals Acute Rehab PT Goals PT Goal Formulation: With patient Time For Goal Achievement: 12/05/12 Potential to Achieve Goals: Good Pt will Roll Supine to Right Side: with modified independence PT Goal: Rolling Supine to Right Side - Progress: Goal set today Pt will go Supine/Side to Sit: with modified independence PT Goal: Supine/Side to Sit - Progress: Goal set today Pt will go Sit to Supine/Side: with modified independence PT Goal: Sit to Supine/Side - Progress: Goal set today Pt will Transfer Bed to Chair/Chair to Bed: with modified independence PT Transfer Goal: Bed to Chair/Chair to Bed - Progress: Goal set today Pt will Ambulate: 51 - 150 feet;with modified independence;with rolling walker PT Goal: Ambulate - Progress: Goal set today Pt will Go Up / Down Stairs: Flight;with supervision;with least restrictive assistive device PT Goal: Up/Down Stairs - Progress: Goal set today Pt will Perform Home Exercise Program: Independently PT Goal: Perform Home Exercise Program - Progress: Goal set today  Visit Information  Last PT Received On: 12/04/12 Assistance Needed: +1    Subjective Data  Subjective: Agree to PT eval Patient Stated Goal: Return to work    Prior Functioning  Home  Living Lives With:  Spouse Available Help at Discharge: Family;Available 24 hours/day Type of Home: House Home Access: Stairs to enter [employer] of Steps: 1 Entrance Stairs-Rails: None Home Layout: Two level;Bed/bath upstairs Alternate Level Stairs-Number of Steps: 13 Alternate Level Stairs-Rails: Left Bathroom Shower/Tub: Walk-in shower;Door Bathroom Accessibility: Yes How Accessible: Accessible via walker Home Adaptive Equipment: Bedside commode/3-in-1;Walker - rolling Prior Function Level of Independence: Independent Able to Take Stairs?: Yes Driving: Yes Vocation: Full time employment Communication Communication: No difficulties    Cognition  Overall Cognitive Status: Appears within functional limits for tasks assessed/performed Arousal/Alertness: Awake/alert Orientation Level: Appears intact for tasks assessed Behavior During Session: [facility] for tasks performed    Extremity/Trunk Assessment Right Upper Extremity Assessment RUE ROM/Strength/Tone: Within functional levels RUE Sensation: WFL - Light Touch;WFL - Proprioception RUE Coordination: WFL - gross/fine motor Left Upper Extremity Assessment LUE ROM/Strength/Tone: Within functional levels LUE Sensation: WFL - Light Touch;WFL - Proprioception LUE Coordination: WFL - gross motor Right Lower Extremity Assessment RLE ROM/Strength/Tone: WFL for tasks assessed RLE Coordination: WFL - gross motor;WFL - gross/fine motor Left Lower Extremity Assessment LLE ROM/Strength/Tone: Deficits;Due to pain LLE ROM/Strength/Tone Deficits: 0-100 degrees AROM in L Knee sitting in chair.    Balance    End of Session PT - End of Session Equipment Utilized During Treatment: Gait belt;Left knee immobilizer Activity Tolerance: Patient tolerated treatment well Patient left: in chair;with call bell/phone within reach Nurse Communication: Mobility status CPM Left Knee CPM Left Knee: Off  GP     Melondy Blanchard 12/04/2012, 12:11  PM  Winslow Ederer L. Dastan Krider DPT [phone]

## 2012-12-04 NOTE — Progress Notes (Signed)
UR COMPLETED  

## 2012-12-04 NOTE — Evaluation (Signed)
Occupational Therapy Evaluation Patient Details Name: Louis Vasquez MRN: [id number] DOB: 13-Dec-1956 Today's Date: 12/04/2012 Time: 4098-1191 OT Time Calculation (min): 23 min  OT Assessment / Plan / Recommendation Clinical Impression  Pt s/p Lt TKA. All education completed and pt/wife with no further questions. Pt states he was instructed to keep KI on when OOB for next 2 weeks. No DME needs and no further acute OT needs indicated at this time. Will sign off.    OT Assessment  Patient does not need any further OT services    Follow Up Recommendations  No OT follow up;Supervision/Assistance - 24 hour    Barriers to Discharge      Equipment Recommendations  None recommended by OT    Recommendations for Other Services    Frequency       Precautions / Restrictions Precautions Precautions: Knee Precaution Booklet Issued: No Precaution Comments: Reviewed knee positioning to promote extension with pt and spouse.  Required Braces or Orthoses: Knee Immobilizer - Left Knee Immobilizer - Left: On except when in CPM Restrictions Weight Bearing Restrictions: Yes LLE Weight Bearing: Weight bearing as tolerated   Pertinent Vitals/Pain Pt reports left knee "soreness" but denies "pain" at this time    ADL  ADL Comments: Pt reports he requires assist with TED hose and shoes for LB dressing but was able to complete donning shorts with only setup. Pt and wife educated on tub/shower and walk-in shower transfers with and without Knee immobilizer. Pt demonstrated understanding. Pt also educated on LB dressing/bathing ADL adaptations. Pt with no further questions at this time. Will sign off as pt is to d/c home today. No DME needs    OT Diagnosis:    OT Problem List:   OT Treatment Interventions:     OT Goals    Visit Information  Last OT Received On: 12/04/12 Assistance Needed: +1    Subjective Data  Subjective: So when am I allowed to bathe? Patient Stated Goal: Return home    Prior Functioning     Home Living Lives With: Spouse Available Help at Discharge: Family;Available 24 hours/day Type of Home: House Home Access: Stairs to enter [employer] of Steps: 1 Entrance Stairs-Rails: None Home Layout: Two level;Bed/bath upstairs Alternate Level Stairs-Number of Steps: 13 Alternate Level Stairs-Rails: Left Bathroom Shower/Tub: Walk-in shower;Door Bathroom Accessibility: Yes How Accessible: Accessible via walker Home Adaptive Equipment: Bedside commode/3-in-1;Walker - rolling Prior Function Level of Independence: Independent Able to Take Stairs?: Yes Driving: Yes Vocation: Full time employment Communication Communication: No difficulties         Vision/Perception     Cognition  Overall Cognitive Status: Appears within functional limits for tasks assessed/performed Arousal/Alertness: Awake/alert Orientation Level: Appears intact for tasks assessed Behavior During Session: [facility] for tasks performed    Extremity/Trunk Assessment Right Upper Extremity Assessment RUE ROM/Strength/Tone: [facility] for tasks assessed Left Upper Extremity Assessment LUE ROM/Strength/Tone: [facility] for tasks assessed     Mobility Bed Mobility Bed Mobility: Supine to Sit;Sitting - Scoot to [employer] of Bed;Sit to Supine Transfers Sit to Stand: 5: Supervision;From bed;From chair/3-in-1 Stand to Sit: 5: Supervision;To chair/3-in-1;With armrests     Shoulder Instructions     Exercise     Balance     End of Session OT - End of Session Activity Tolerance: Patient tolerated treatment well Patient left: with family/visitor present (standing in room with wife) Nurse Communication: Mobility status  GO     Louis Vasquez 12/04/2012, 3:58 PM

## 2012-12-04 NOTE — Discharge Summary (Signed)
Patient ID: Louis Vasquez MRN: [id number] DOB/AGE: 1956-01-06 56 y.o.  Admit date: 12/03/2012 Discharge date: 12/04/2012  Admission Diagnoses:  Principal Problem:  *Left knee DJD Active Problems:  Hypertension  Obesity (BMI 30-39.9)   Discharge Diagnoses:  Same  Past Medical History  Diagnosis Date  . Hypertension   . Obesity (BMI 30-39.9)   . Left knee DJD     Surgeries: Procedure(s): TOTAL KNEE ARTHROPLASTY on 12/03/2012   Consultants:    Discharged Condition: Improved  Hospital Course: AH BOTT is an 56 y.o. male who was admitted 12/03/2012 for operative treatment ofLeft knee DJD. Patient has severe unremitting pain that affects sleep, daily activities, and work/hobbies. After pre-op clearance the patient was taken to the operating room on 12/03/2012 and underwent  Procedure(s): TOTAL KNEE ARTHROPLASTY.    Patient was given perioperative antibiotics: Anti-infectives     Start     Dose/Rate Route Frequency Ordered Stop   12/03/12 1600   ceFAZolin (ANCEF) IVPB 2 g/50 mL premix        2 g 100 mL/hr over 30 Minutes Intravenous Every 6 hours 12/03/12 1505 12/04/12 0300   12/03/12 1039   cefUROXime (ZINACEF) injection  Status:  Discontinued          As needed 12/03/12 1040 12/03/12 1207   12/02/12 1104   ceFAZolin (ANCEF) 3 g in dextrose 5 % 50 mL IVPB        3 g 160 mL/hr over 30 Minutes Intravenous 60 min pre-op 12/02/12 1104 12/03/12 1010           Patient was given sequential compression devices, early ambulation, and chemoprophylaxis to prevent DVT.  Patient benefited maximally from hospital stay and there were no complications.    Recent vital signs: Patient Vitals for the past 24 hrs:  BP Temp Temp src Pulse Resp SpO2  12/04/12 0642 142/68 mmHg 97.3 F (36.3 C) - 61  18  99 %  12/04/12 0228 162/87 mmHg 98 F (36.7 C) - 78  18  99 %  12/03/12 2151 139/80 mmHg 99.3 F (37.4 C) Oral 88  18  96 %  12/03/12 1600 - - - - 16  98 %  12/03/12  1440 146/80 mmHg 97.4 F (36.3 C) Oral 80  18  98 %  12/03/12 1428 - 97.1 F (36.2 C) - - - -  12/03/12 1422 160/92 mmHg - - 74  11  96 %  12/03/12 1407 155/88 mmHg - - 78  11  95 %  12/03/12 1337 171/85 mmHg - - 77  12  96 %  12/03/12 1315 165/86 mmHg - - 77  10  96 %  12/03/12 1300 167/91 mmHg - - 79  12  97 %  12/03/12 1245 164/96 mmHg - - 85  14  99 %  12/03/12 1230 186/102 mmHg - - 89  15  100 %  12/03/12 1216 169/99 mmHg 97.6 F (36.4 C) - 92  16  99 %     Recent laboratory studies:  [facility] 12/04/12 0550  WBC 15.8*  HGB 12.9*  HCT 38.2*  PLT 152  NA 142  K 3.8  CL 104  CO2 25  BUN 10  CREATININE 0.68  GLUCOSE 149*  INR --  CALCIUM 8.6     Discharge Medications:     Medication List     As of 12/04/2012  9:23 AM    TAKE these medications         acetaminophen  500 MG tablet   Commonly known as: TYLENOL   Take 2 tablets (1,000 mg total) by mouth every 6 (six) hours as needed for pain or fever. For pain      celecoxib 200 MG capsule   Commonly known as: CELEBREX   1 tablet daily with food for pain and swelling.      DSS 100 MG Caps   1 tab 2 times a day while on narcotics.  STOOL SOFTENER      enoxaparin 30 MG/0.3ML injection   Commonly known as: LOVENOX   Inject 0.3 mLs (30 mg total) into the skin every 12 (twelve) hours.      oxyCODONE 5 MG immediate release tablet   Commonly known as: Oxy IR/ROXICODONE   1-2 tablets every 4-6 hrs as needed for pain      valsartan 160 MG tablet   Commonly known as: DIOVAN   Take 160 mg by mouth daily.        Diagnostic Studies: Dg Chest 2 View  11/26/2012  *RADIOLOGY REPORT*  Clinical Data: Preadmission respiratory films.  CHEST - 2 VIEW  Comparison: None.  Findings: Lungs clear. Heart size normal. No pneumothorax or pleural effusion.  IMPRESSION: Negative chest.   Original Report Authenticated By: Holley Dexter, M.D.     Disposition: Final discharge disposition not confirmed      Discharge Orders     Future Orders Please Complete By Expires   Diet - low sodium heart healthy      Call MD / Call 911      Comments:   If you experience chest pain or shortness of breath, CALL 911 and be transported to the hospital emergency room.  If you develope a fever above 101 F, pus (white drainage) or increased drainage or redness at the wound, or calf pain, call your surgeon's office.   Discharge instructions      Comments:   Total Knee Replacement Care After Refer to this sheet in the next few weeks. These discharge instructions provide you with general information on caring for yourself after you leave the hospital. Your caregiver may also give you specific instructions. Your treatment has been planned according to the most current medical practices available, but unavoidable complications sometimes occur. If you have any problems or questions after discharge, please call your caregiver. Regaining a near full range of motion of your knee within the first 3 to 6 weeks after surgery is critical. HOME CARE INSTRUCTIONS  You may resume a normal diet and activities as directed.  Perform exercises as directed.  Place yellow foam block, yellow side up under heel at all times except when in CPM or when walking.  DO NOT modify, tear, cut, or change in any way. You will receive physical therapy daily  Take showers instead of baths until informed otherwise.  Change bandages (dressings)daily Do not take over-the-counter or prescription medicines for pain, discomfort, or fever. Eat a well-balanced diet.  Avoid lifting or driving until you are instructed otherwise.  Make an appointment to see your caregiver for stitches (suture) or staple removal as directed.  If you have been sent home with a continuous passive motion machine (CPM machine), 0-90 degrees 6 hrs a day   2 hrs a shift SEEK MEDICAL CARE IF: You have swelling of your calf or leg.  You develop shortness of breath or chest pain.  You have redness,  swelling, or increasing pain in the wound.  There is pus or any unusual drainage  coming from the surgical site.  You notice a bad smell coming from the surgical site or dressing.  The surgical site breaks open after sutures or staples have been removed.  There is persistent bleeding from the suture or staple line.  You are getting worse or are not improving.  You have any other questions or concerns.  SEEK IMMEDIATE MEDICAL CARE IF:  You have a fever.  You develop a rash.  You have difficulty breathing.  You develop any reaction or side effects to medicines given.  Your knee motion is decreasing rather than improving.  MAKE SURE YOU:  Understand these instructions.  Will watch your condition.  Will get help right away if you are not doing well or get worse.   Constipation Prevention      Comments:   Drink plenty of fluids.  Prune juice may be helpful.  You may use a stool softener, such as Colace (over the counter) 100 mg twice a day.  Use MiraLax (over the counter) for constipation as needed.   Increase activity slowly as tolerated      CPM      Comments:   Continuous passive motion machine (CPM):      Use the CPM from 0 to 90 for 6 hours per day.       You may break it up into 2 or 3 sessions per day.      Use CPM for 2 weeks or until you are told to stop.   TED hose      Comments:   Use stockings (TED hose) for 2 weeks on both leg(s).  You may remove them at night for sleeping.   Change dressing      Comments:   Change the dressing daily with sterile 4 x 4 inch gauze dressing and apply TED hose.  You may clean the incision with alcohol prior to redressing.   Do not put a pillow under the knee. Place it under the heel.      Comments:   Place yellow foam block, yellow side up under heel at all times except when in CPM or when walking.  DO NOT modify, tear, cut, or change in any way the yellow foam block.      Follow-up Information    Follow up with Pascal Lux, PA.  On 12/17/2012. (appt time 2:30 pm)    Contact information:   [address] [place] [address] [phone]           Signed: Pascal Lux 12/04/2012, 9:23 AM

## 2012-12-04 NOTE — Care Management Note (Signed)
    Page 1 of 1   12/04/2012     12:02:28 PM   CARE MANAGEMENT NOTE 12/04/2012  Patient:  Louis Vasquez, Louis Vasquez   Account Number:  [id number]  Date Initiated:  12/04/2012  Documentation initiated by:  Anette Guarneri  Subjective/Objective Assessment:   POD#1 s/p left TKA  MD office pre-arranged [facility] services  has DME     Action/Plan:   home with [facility] services   Anticipated DC Date:  12/04/2012   Anticipated DC Plan:  HOME W HOME HEALTH SERVICES      DC Planning Services  CM consult      Choice offered to / List presented to:             Status of service:  Completed, signed off Medicare Important Message given?  NO (If response is "NO", the following Medicare IM given date fields will be blank) Date Medicare IM given:   Date Additional Medicare IM given:    Discharge Disposition:  HOME W HOME HEALTH SERVICES  Per UR Regulation:    If discussed at Long Length of Stay Meetings, dates discussed:    Comments:  12/04/12 12:01  Anette Guarneri RN/CM spoke with patient regarding d/c needs. MD office pre-arranged HHPT with Louis Vasquez per MR. Louis Vasquez he has RW/3n1 and CPM at home.

## 2012-12-04 NOTE — Discharge Summary (Signed)
Patient ID: [name] MRN: [id number] DOB/AGE: [birth year] 56 y.o.  Admit date: 12/03/2012 Discharge date: 12/04/2012  Admission Diagnoses:  Principal Problem:  *Left knee DJD Active Problems:  Hypertension  Obesity (BMI 30-39.9)   Discharge Diagnoses:  Same  Past Medical History  Diagnosis Date  . Hypertension   . Obesity (BMI 30-39.9)   . Left knee DJD     Surgeries: Procedure(s): TOTAL KNEE ARTHROPLASTY on 12/03/2012   Consultants:    Discharged Condition: Improved  Hospital Course: TRAVONTA GILL is an 56 y.o. male who was admitted 12/03/2012 for operative treatment ofLeft knee DJD. Patient has severe unremitting pain that affects sleep, daily activities, and work/hobbies. After pre-op clearance the patient was taken to the operating room on 12/03/2012 and underwent  Procedure(s): TOTAL KNEE ARTHROPLASTY.    Patient was given perioperative antibiotics: Anti-infectives     Start     Dose/Rate Route Frequency Ordered Stop   12/03/12 1600   ceFAZolin (ANCEF) IVPB 2 g/50 mL premix        2 g 100 mL/hr over 30 Minutes Intravenous Every 6 hours 12/03/12 1505 12/04/12 0300   12/03/12 1039   cefUROXime (ZINACEF) injection  Status:  Discontinued          As needed 12/03/12 1040 12/03/12 1207   12/02/12 1104   ceFAZolin (ANCEF) 3 g in dextrose 5 % 50 mL IVPB        3 g 160 mL/hr over 30 Minutes Intravenous 60 min pre-op 12/02/12 1104 12/03/12 1010           Patient was given sequential compression devices, early ambulation, and chemoprophylaxis to prevent DVT.  Patient benefited maximally from hospital stay and there were no complications.    Recent vital signs: Patient Vitals for the past 24 hrs:  BP Temp Temp src Pulse Resp SpO2  12/04/12 0642 142/68 mmHg 97.3 F (36.3 C) - 61  18  99 %  12/04/12 0228 162/87 mmHg 98 F (36.7 C) - 78  18  99 %  12/03/12 2151 139/80 mmHg 99.3 F (37.4 C) Oral 88  18  96 %  12/03/12 1600 - - - - 16  98 %  12/03/12  1440 146/80 mmHg 97.4 F (36.3 C) Oral 80  18  98 %  12/03/12 1428 - 97.1 F (36.2 C) - - - -  12/03/12 1422 160/92 mmHg - - 74  11  96 %  12/03/12 1407 155/88 mmHg - - 78  11  95 %  12/03/12 1337 171/85 mmHg - - 77  12  96 %  12/03/12 1315 165/86 mmHg - - 77  10  96 %  12/03/12 1300 167/91 mmHg - - 79  12  97 %  12/03/12 1245 164/96 mmHg - - 85  14  99 %  12/03/12 1230 186/102 mmHg - - 89  15  100 %  12/03/12 1216 169/99 mmHg 97.6 F (36.4 C) - 92  16  99 %     Recent laboratory studies:  [facility] 12/04/12 0550  WBC 15.8*  HGB 12.9*  HCT 38.2*  PLT 152  NA 142  K 3.8  CL 104  CO2 25  BUN 10  CREATININE 0.68  GLUCOSE 149*  INR --  CALCIUM 8.6     Discharge Medications:     Medication List     As of 12/04/2012  9:21 AM    TAKE these medications         acetaminophen  500 MG tablet   Commonly known as: TYLENOL   Take 2 tablets (1,000 mg total) by mouth every 6 (six) hours as needed for pain or fever. For pain      celecoxib 200 MG capsule   Commonly known as: CELEBREX   1 tablet daily with food for pain and swelling.      DSS 100 MG Caps   1 tab 2 times a day while on narcotics.  STOOL SOFTENER      enoxaparin 30 MG/0.3ML injection   Commonly known as: LOVENOX   Inject 0.3 mLs (30 mg total) into the skin every 12 (twelve) hours.      oxyCODONE 5 MG immediate release tablet   Commonly known as: Oxy IR/ROXICODONE   1-2 tablets every 4-6 hrs as needed for pain      valsartan 160 MG tablet   Commonly known as: DIOVAN   Take 160 mg by mouth daily.        Diagnostic Studies: Dg Chest 2 View  11/26/2012  *RADIOLOGY REPORT*  Clinical Data: Preadmission respiratory films.  CHEST - 2 VIEW  Comparison: None.  Findings: Lungs clear. Heart size normal. No pneumothorax or pleural effusion.  IMPRESSION: Negative chest.   Original Report Authenticated By: Holley Dexter, M.D.     Disposition: Final discharge disposition not confirmed      Discharge Orders     Future Orders Please Complete By Expires   Diet - low sodium heart healthy      Call MD / Call 911      Comments:   If you experience chest pain or shortness of breath, CALL 911 and be transported to the hospital emergency room.  If you develope a fever above 101 F, pus (white drainage) or increased drainage or redness at the wound, or calf pain, call your surgeon's office.   Discharge instructions      Comments:   Total Knee Replacement Care After Refer to this sheet in the next few weeks. These discharge instructions provide you with general information on caring for yourself after you leave the hospital. Your caregiver may also give you specific instructions. Your treatment has been planned according to the most current medical practices available, but unavoidable complications sometimes occur. If you have any problems or questions after discharge, please call your caregiver. Regaining a near full range of motion of your knee within the first 3 to 6 weeks after surgery is critical. HOME CARE INSTRUCTIONS  You may resume a normal diet and activities as directed.  Perform exercises as directed.  Place yellow foam block, yellow side up under heel at all times except when in CPM or when walking.  DO NOT modify, tear, cut, or change in any way. You will receive physical therapy daily  Take showers instead of baths until informed otherwise.  Change bandages (dressings)daily Do not take over-the-counter or prescription medicines for pain, discomfort, or fever. Eat a well-balanced diet.  Avoid lifting or driving until you are instructed otherwise.  Make an appointment to see your caregiver for stitches (suture) or staple removal as directed.  If you have been sent home with a continuous passive motion machine (CPM machine), 0-90 degrees 6 hrs a day   2 hrs a shift SEEK MEDICAL CARE IF: You have swelling of your calf or leg.  You develop shortness of breath or chest pain.  You have redness,  swelling, or increasing pain in the wound.  There is pus or any unusual drainage  coming from the surgical site.  You notice a bad smell coming from the surgical site or dressing.  The surgical site breaks open after sutures or staples have been removed.  There is persistent bleeding from the suture or staple line.  You are getting worse or are not improving.  You have any other questions or concerns.  SEEK IMMEDIATE MEDICAL CARE IF:  You have a fever.  You develop a rash.  You have difficulty breathing.  You develop any reaction or side effects to medicines given.  Your knee motion is decreasing rather than improving.  MAKE SURE YOU:  Understand these instructions.  Will watch your condition.  Will get help right away if you are not doing well or get worse.   Constipation Prevention      Comments:   Drink plenty of fluids.  Prune juice may be helpful.  You may use a stool softener, such as Colace (over the counter) 100 mg twice a day.  Use MiraLax (over the counter) for constipation as needed.   Increase activity slowly as tolerated      CPM      Comments:   Continuous passive motion machine (CPM):      Use the CPM from 0 to 90 for 6 hours per day.       You may break it up into 2 or 3 sessions per day.      Use CPM for 2 weeks or until you are told to stop.   TED hose      Comments:   Use stockings (TED hose) for 2 weeks on both leg(s).  You may remove them at night for sleeping.   Change dressing      Comments:   Change the dressing daily with sterile 4 x 4 inch gauze dressing and apply TED hose.  You may clean the incision with alcohol prior to redressing.   Do not put a pillow under the knee. Place it under the heel.      Comments:   Place yellow foam block, yellow side up under heel at all times except when in CPM or when walking.  DO NOT modify, tear, cut, or change in any way the yellow foam block.      Follow-up Information    Follow up with Pascal Lux, PA.  On 12/17/2012. (appt time 2:30 pm)    Contact information:   [address] [place] [address] [phone]           Signed: Pascal Lux 12/04/2012, 9:21 AM

## 2012-12-04 NOTE — Progress Notes (Signed)
Physical Therapy Treatment Patient Details Name: Louis Vasquez MRN: [id number] DOB: Jan 18, 1956 Today's Date: 12/04/2012 Time: 1410-1506 PT Time Calculation (min): 56 min  PT Assessment / Plan / Recommendation Comments on Treatment Session  Pt has met all acute PT goals and is appropriate for d/c home.     Follow Up Recommendations  Home health PT     Equipment Recommendations  None recommended by PT    Recommendations for Other Services    Frequency 7X/week   Plan All goals met and education completed, patient dischaged from PT services    Precautions / Restrictions Precautions Precautions: Knee Precaution Booklet Issued: No Precaution Comments: Reviewed knee positioning to promote extension with pt and spouse.  Required Braces or Orthoses: Knee Immobilizer - Left Knee Immobilizer - Left: On except when in CPM Restrictions Weight Bearing Restrictions: Yes LLE Weight Bearing: Weight bearing as tolerated   Pertinent Vitals/Pain No c/o pain    Mobility  Bed Mobility Bed Mobility: Supine to Sit;Sitting - Scoot to Edge of Bed;Sit to Supine Supine to Sit: 6: Modified independent (Device/Increase time) Sitting - Scoot to Edge of Bed: 6: Modified independent (Device/Increase time) Sit to Supine: 6: Modified independent (Device/Increase time) Transfers Transfers: Sit to Stand;Stand to Sit Sit to Stand: 6: Modified independent (Device/Increase time) Stand to Sit: 6: Modified independent (Device/Increase time) Ambulation/Gait Ambulation/Gait Assistance: 6: Modified independent (Device/Increase time) Ambulation Distance (Feet): 200 Feet Assistive device: Rolling walker Ambulation/Gait Assistance Details: Cueing for reciprocal gait Gait Pattern: Step-to pattern Stairs: No    Exercises Total Joint Exercises Ankle Circles/Pumps: Both;10 reps Quad Sets: Left;10 reps Short Arc Quad: Left;5 reps;AAROM Heel Slides: Right;10 reps;AROM Hip ABduction/ADduction: Left;10  reps;AROM Straight Leg Raises: AAROM;Left;5 reps;AROM;10 reps (10 reps independent with KI on) Knee Flexion: Left;5 reps;AROM   PT Diagnosis:    PT Problem List:   PT Treatment Interventions:     PT Goals Acute Rehab PT Goals PT Goal Formulation: With patient Time For Goal Achievement: 12/05/12 Potential to Achieve Goals: Good Pt will Roll Supine to Right Side: with modified independence PT Goal: Rolling Supine to Right Side - Progress: Met Pt will go Supine/Side to Sit: with modified independence PT Goal: Supine/Side to Sit - Progress: Met Pt will go Sit to Supine/Side: with modified independence PT Goal: Sit to Supine/Side - Progress: Met Pt will Transfer Bed to Chair/Chair to Bed: with modified independence PT Transfer Goal: Bed to Chair/Chair to Bed - Progress: Met Pt will Ambulate: 51 - 150 feet;with modified independence;with rolling walker PT Goal: Ambulate - Progress: Met Pt will Go Up / Down Stairs: Flight;with supervision;with least restrictive assistive device PT Goal: Up/Down Stairs - Progress: Met Pt will Perform Home Exercise Program: Independently PT Goal: Perform Home Exercise Program - Progress: Met  Visit Information  Last PT Received On: 12/04/12 Assistance Needed: +1    Subjective Data      Cognition  Overall Cognitive Status: Appears within functional limits for tasks assessed/performed Arousal/Alertness: Awake/alert Orientation Level: Appears intact for tasks assessed Behavior During Session: [facility] for tasks performed    Balance     End of Session PT - End of Session Equipment Utilized During Treatment: Gait belt;Left knee immobilizer Activity Tolerance: Patient tolerated treatment well Patient left: in bed;with call bell/phone within reach Nurse Communication: Mobility status   GP     [name] 12/04/2012, 4:24 PM Davonna Ertl L. Tamar Miano DPT [phone]

## 2012-12-04 NOTE — Progress Notes (Signed)
Lovenox teaching done with pt and wife. Pt verbalized understanding of how to adim injection.

## 2013-04-17 IMAGING — CR DG CHEST 2V
2 series · 2 of 2 positions shown · non-contrast
Comparison: None.

CLINICAL DATA: Preadmission respiratory films.

CHEST - 2 VIEW

[view not recorded (1 of 2)]
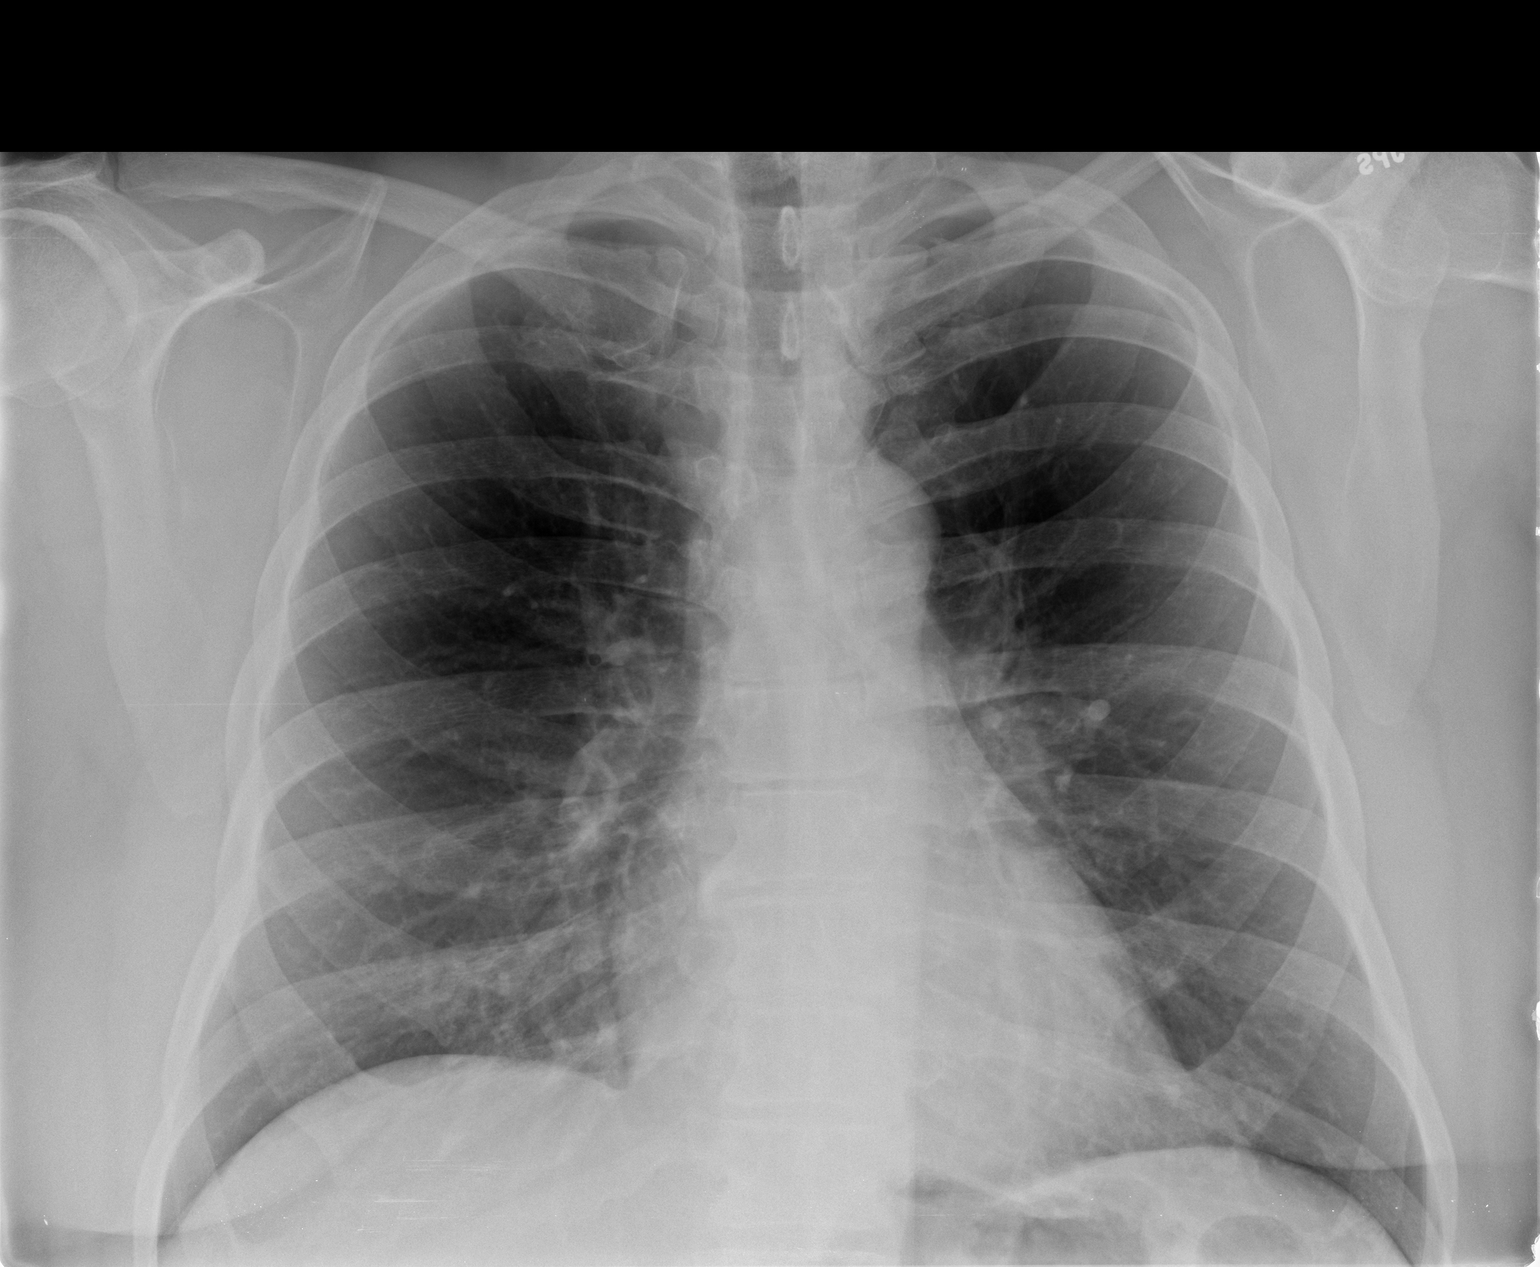

[view not recorded (2 of 2)]
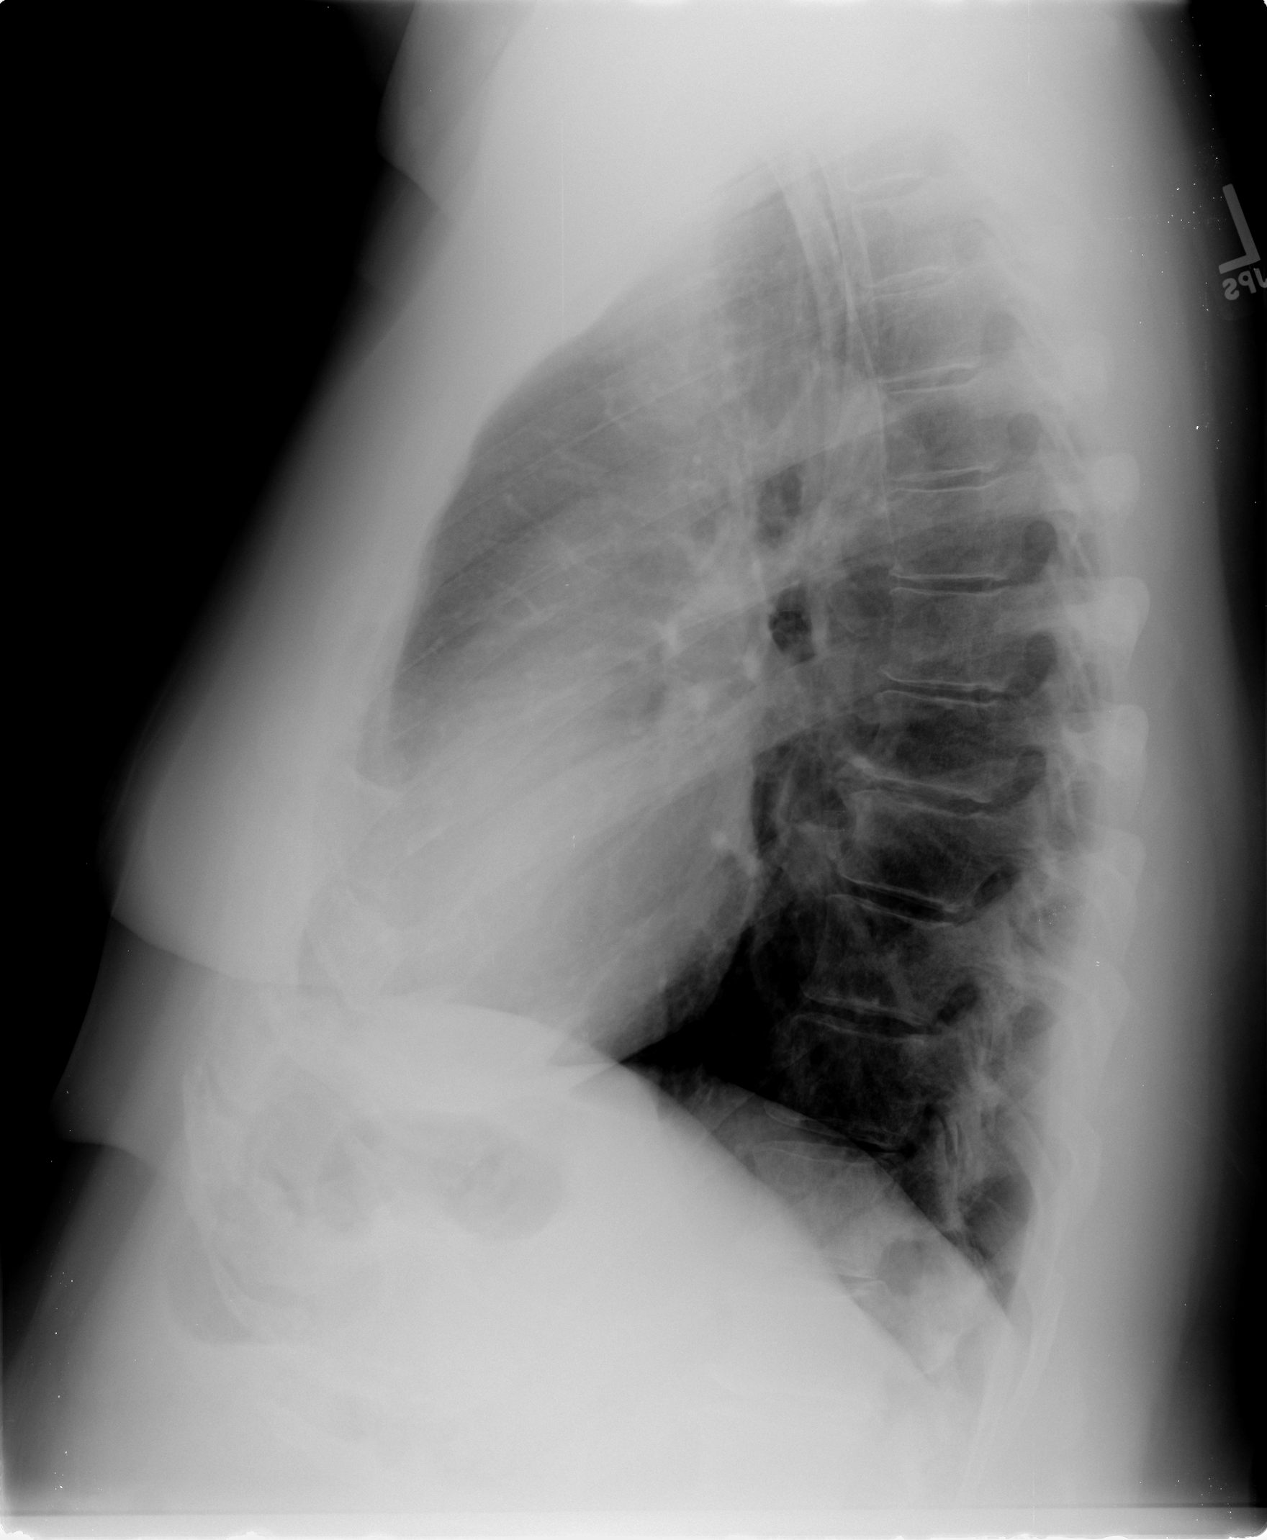

[2 of 2 positions shown; findings below may reference images not displayed]

FINDINGS: Lungs clear. Heart size normal. No pneumothorax or
pleural effusion.
IMPRESSION: Negative chest.

## 2014-12-05 ENCOUNTER — Ambulatory Visit (INDEPENDENT_AMBULATORY_CARE_PROVIDER_SITE_OTHER): Payer: BC Managed Care – PPO | Admitting: Family Medicine

## 2014-12-05 VITALS — BP 200/110 | HR 87 | Temp 98.4°F | Resp 16 | Ht 69.0 in | Wt 262.0 lb

## 2014-12-05 DIAGNOSIS — H6123 Impacted cerumen, bilateral: Secondary | ICD-10-CM

## 2014-12-05 NOTE — Progress Notes (Addendum)
° °  Subjective:    Patient ID: Louis Vasquez, male    DOB: 1956/12/02, 58 y.o.   MRN: [id number] This chart was scribed for Robyn Haber, MD by Zola Button, Medical Scribe. This patient was seen in Room 4 and the patient's care was started at 5:36 PM.   HPI HPI Comments: Louis Vasquez is a 58 y.o. male who presents to the [facility] complaining of cerumen impaction and wants his ears cleaned out. Patient states he used to have cerumen impaction often in his late 76s and 34s. He has an upcoming physical next month with Dr. Marcellus Scott.    Review of Systems  HENT: Positive for ear discharge.        Objective:   Physical Exam CONSTITUTIONAL: Well developed/well nourished HEAD: Normocephalic/atraumatic EYES: EOM/PERRL ENMT: Mucous membranes moist. Cerumen impaction bilaterally NECK: supple no meningeal signs SPINE: entire spine nontender CV: S1/S2 noted, no murmurs/rubs/gallops noted LUNGS: Lungs are clear to auscultation bilaterally, no apparent distress ABDOMEN: soft, nontender, no rebound or guarding GU: no cva tenderness NEURO: Pt is awake/alert, moves all extremitiesx4 EXTREMITIES: pulses normal, full ROM SKIN: warm, color normal PSYCH: no abnormalities of mood noted     Assessment & Plan:  Cerumen impaction lavaged clear with good patient relief  Patient will follow up BP check in 1-2 weeks with PCP Dr. Mertha Finders  Robyn Haber, MD

## 2015-04-27 DIAGNOSIS — I10 Essential (primary) hypertension: Secondary | ICD-10-CM | POA: Insufficient documentation

## 2015-04-27 DIAGNOSIS — IMO0002 Reserved for concepts with insufficient information to code with codable children: Secondary | ICD-10-CM | POA: Insufficient documentation

## 2015-04-27 DIAGNOSIS — M1710 Unilateral primary osteoarthritis, unspecified knee: Secondary | ICD-10-CM | POA: Insufficient documentation

## 2015-04-27 DIAGNOSIS — E669 Obesity, unspecified: Secondary | ICD-10-CM | POA: Insufficient documentation

## 2017-01-11 DIAGNOSIS — H52213 Irregular astigmatism, bilateral: Secondary | ICD-10-CM | POA: Diagnosis not present

## 2017-03-20 DIAGNOSIS — Z947 Corneal transplant status: Secondary | ICD-10-CM | POA: Diagnosis not present

## 2017-03-20 DIAGNOSIS — H52211 Irregular astigmatism, right eye: Secondary | ICD-10-CM | POA: Diagnosis not present

## 2017-03-22 DIAGNOSIS — H52211 Irregular astigmatism, right eye: Secondary | ICD-10-CM | POA: Diagnosis not present

## 2017-08-28 DIAGNOSIS — M179 Osteoarthritis of knee, unspecified: Secondary | ICD-10-CM | POA: Diagnosis not present

## 2017-08-28 DIAGNOSIS — E669 Obesity, unspecified: Secondary | ICD-10-CM | POA: Diagnosis not present

## 2017-08-28 DIAGNOSIS — Z0000 Encounter for general adult medical examination without abnormal findings: Secondary | ICD-10-CM | POA: Diagnosis not present

## 2017-08-28 DIAGNOSIS — Z125 Encounter for screening for malignant neoplasm of prostate: Secondary | ICD-10-CM | POA: Diagnosis not present

## 2017-08-28 DIAGNOSIS — I10 Essential (primary) hypertension: Secondary | ICD-10-CM | POA: Diagnosis not present

## 2017-08-28 DIAGNOSIS — E782 Mixed hyperlipidemia: Secondary | ICD-10-CM | POA: Diagnosis not present

## 2017-08-28 DIAGNOSIS — Z79899 Other long term (current) drug therapy: Secondary | ICD-10-CM | POA: Diagnosis not present

## 2017-09-28 DIAGNOSIS — M7652 Patellar tendinitis, left knee: Secondary | ICD-10-CM | POA: Diagnosis not present

## 2018-04-12 DIAGNOSIS — M7662 Achilles tendinitis, left leg: Secondary | ICD-10-CM | POA: Diagnosis not present

## 2018-08-23 DIAGNOSIS — M1711 Unilateral primary osteoarthritis, right knee: Secondary | ICD-10-CM | POA: Diagnosis not present

## 2018-12-17 DIAGNOSIS — M1711 Unilateral primary osteoarthritis, right knee: Secondary | ICD-10-CM | POA: Diagnosis not present

## 2018-12-19 HISTORY — PX: TOTAL KNEE ARTHROPLASTY: SHX125

## 2019-09-09 DIAGNOSIS — M1711 Unilateral primary osteoarthritis, right knee: Secondary | ICD-10-CM | POA: Diagnosis not present

## 2019-09-09 DIAGNOSIS — M25562 Pain in left knee: Secondary | ICD-10-CM | POA: Diagnosis not present

## 2019-09-16 DIAGNOSIS — Z23 Encounter for immunization: Secondary | ICD-10-CM | POA: Diagnosis not present

## 2019-11-12 DIAGNOSIS — M179 Osteoarthritis of knee, unspecified: Secondary | ICD-10-CM | POA: Diagnosis not present

## 2019-11-12 DIAGNOSIS — Z125 Encounter for screening for malignant neoplasm of prostate: Secondary | ICD-10-CM | POA: Diagnosis not present

## 2019-11-12 DIAGNOSIS — Z0000 Encounter for general adult medical examination without abnormal findings: Secondary | ICD-10-CM | POA: Diagnosis not present

## 2019-11-12 DIAGNOSIS — Z1211 Encounter for screening for malignant neoplasm of colon: Secondary | ICD-10-CM | POA: Diagnosis not present

## 2019-11-12 DIAGNOSIS — H6123 Impacted cerumen, bilateral: Secondary | ICD-10-CM | POA: Diagnosis not present

## 2019-11-12 DIAGNOSIS — E782 Mixed hyperlipidemia: Secondary | ICD-10-CM | POA: Diagnosis not present

## 2019-11-12 DIAGNOSIS — E669 Obesity, unspecified: Secondary | ICD-10-CM | POA: Diagnosis not present

## 2019-11-12 DIAGNOSIS — I10 Essential (primary) hypertension: Secondary | ICD-10-CM | POA: Diagnosis not present

## 2019-11-17 DIAGNOSIS — M1711 Unilateral primary osteoarthritis, right knee: Secondary | ICD-10-CM | POA: Diagnosis not present

## 2019-11-25 DIAGNOSIS — M25561 Pain in right knee: Secondary | ICD-10-CM | POA: Diagnosis not present

## 2019-11-25 DIAGNOSIS — H52211 Irregular astigmatism, right eye: Secondary | ICD-10-CM | POA: Diagnosis not present

## 2019-11-25 DIAGNOSIS — N410 Acute prostatitis: Secondary | ICD-10-CM | POA: Diagnosis not present

## 2019-11-25 DIAGNOSIS — M1711 Unilateral primary osteoarthritis, right knee: Secondary | ICD-10-CM | POA: Diagnosis not present

## 2019-12-04 DIAGNOSIS — M1711 Unilateral primary osteoarthritis, right knee: Secondary | ICD-10-CM | POA: Diagnosis not present

## 2020-01-27 ENCOUNTER — Encounter (HOSPITAL_COMMUNITY): Payer: Self-pay

## 2020-01-27 ENCOUNTER — Other Ambulatory Visit: Payer: Self-pay | Admitting: Physician Assistant

## 2020-01-27 DIAGNOSIS — M7989 Other specified soft tissue disorders: Secondary | ICD-10-CM

## 2020-01-27 DIAGNOSIS — Z125 Encounter for screening for malignant neoplasm of prostate: Secondary | ICD-10-CM | POA: Diagnosis not present

## 2020-01-27 DIAGNOSIS — M79604 Pain in right leg: Secondary | ICD-10-CM

## 2020-01-28 ENCOUNTER — Ambulatory Visit (HOSPITAL_COMMUNITY)
Admission: RE | Admit: 2020-01-28 | Discharge: 2020-01-28 | Disposition: A | Discharge status: Home / Self Care | Payer: Self-pay | Source: Ambulatory Visit | Attending: Physician Assistant | Admitting: Physician Assistant

## 2020-01-28 ENCOUNTER — Other Ambulatory Visit: Payer: Self-pay

## 2020-01-28 DIAGNOSIS — M79604 Pain in right leg: Secondary | ICD-10-CM | POA: Insufficient documentation

## 2020-01-28 DIAGNOSIS — M7989 Other specified soft tissue disorders: Secondary | ICD-10-CM | POA: Insufficient documentation

## 2020-01-28 NOTE — Progress Notes (Signed)
Lower extremity venous has been completed.   Preliminary results in CV Proc.   Attempted to call results, no answer.   Louis Vasquez 01/28/2020 10:19 AM

## 2020-03-25 DIAGNOSIS — R3915 Urgency of urination: Secondary | ICD-10-CM | POA: Diagnosis not present

## 2020-03-25 DIAGNOSIS — N401 Enlarged prostate with lower urinary tract symptoms: Secondary | ICD-10-CM | POA: Diagnosis not present

## 2020-03-25 DIAGNOSIS — N5201 Erectile dysfunction due to arterial insufficiency: Secondary | ICD-10-CM | POA: Diagnosis not present

## 2020-03-25 DIAGNOSIS — R972 Elevated prostate specific antigen [PSA]: Secondary | ICD-10-CM | POA: Diagnosis not present

## 2020-05-29 DIAGNOSIS — C61 Malignant neoplasm of prostate: Secondary | ICD-10-CM | POA: Diagnosis not present

## 2020-05-29 DIAGNOSIS — R972 Elevated prostate specific antigen [PSA]: Secondary | ICD-10-CM | POA: Diagnosis not present

## 2020-07-02 DIAGNOSIS — C61 Malignant neoplasm of prostate: Secondary | ICD-10-CM | POA: Diagnosis not present

## 2020-07-30 NOTE — Progress Notes (Signed)
GU Location of Tumor / Histology: prostatic adenocarcinoma  If Prostate Cancer, Gleason Score is (3 + 4) and PSA is (6.7). Prostate volume:43.7  Louis Vasquez presented with an elevated PSA of 6.7.  Biopsies of prostate (if applicable) revealed:    Past/Anticipated interventions by urology, if any: prostate biopsy, prescribed tamsulosin, referred to Dr. Tammi Klippel to discuss brachytherapy  Past/Anticipated interventions by medical oncology, if any: no  Weight changes, if any: no  Bowel/Bladder complaints, if any: Reports urinary urgency and rare urge incontinence. Reports erectile dysfunction.    Nausea/Vomiting, if any: no  Pain issues, if any:    SAFETY ISSUES:  Prior radiation?   Pacemaker/ICD?   Possible current pregnancy? no, male patient  Is the patient on methotrexate?   Current Complaints / other details:  64 year old male. Married with 3 daughters. Father with history of liver ca.

## 2020-08-04 ENCOUNTER — Telehealth: Payer: Self-pay | Admitting: *Deleted

## 2020-08-04 ENCOUNTER — Other Ambulatory Visit: Payer: Self-pay

## 2020-08-04 ENCOUNTER — Ambulatory Visit
Admission: RE | Admit: 2020-08-04 | Discharge: 2020-08-04 | Disposition: A | Discharge status: Home / Self Care | Payer: BC Managed Care – PPO | Source: Ambulatory Visit | Attending: Radiation Oncology | Admitting: Radiation Oncology

## 2020-08-04 ENCOUNTER — Encounter: Payer: Self-pay | Admitting: Radiation Oncology

## 2020-08-04 DIAGNOSIS — C61 Malignant neoplasm of prostate: Secondary | ICD-10-CM

## 2020-08-04 DIAGNOSIS — R972 Elevated prostate specific antigen [PSA]: Secondary | ICD-10-CM | POA: Diagnosis not present

## 2020-08-04 DIAGNOSIS — Z808 Family history of malignant neoplasm of other organs or systems: Secondary | ICD-10-CM | POA: Diagnosis not present

## 2020-08-04 NOTE — Progress Notes (Signed)
Radiation Oncology         [phone] ________________________________  Initial Outpatient Consultation - Conducted via Telephone due to current COVID-19 concerns for limiting patient exposure  Name: Louis Vasquez MRN: [id number]  Date: 08/04/2020  DOB: 21-Jun-1956  OI:NOMVEHM, No Pcp Per  Raynelle Bring, MD   REFERRING PHYSICIAN: Raynelle Bring, MD  DIAGNOSIS: 64 y.o. gentleman with Stage T1c adenocarcinoma of the prostate with Gleason score of 3+4, and PSA of 6.7.    ICD-10-CM   1. Malignant neoplasm of prostate ([place])  C61     HISTORY OF PRESENT ILLNESS: Louis Vasquez is a 64 y.o. male with a diagnosis of prostate cancer. He is a locally practicing attorney and was noted to have an elevated PSA of 7.19 in 10/2019 by his primary care physician, Dr. Inda Merlin.  He was treated with a course of Bactrim for possible prostatitis, and this reportedly improved his PSA to around 5.  He was also prescribed tamsulosin around this time, which has improved his urinary symptoms significantly.  He underwent a total knee replacement in the interim, and a repeat PSA performed in 01/2020 showed elevation to 6.7. Accordingly, he was referred for evaluation in urology by Dr. Alinda Money on 03/25/2020,  digital rectal examination was performed at that time revealing no nodules.  The patient proceeded to transrectal ultrasound with 12 biopsies of the prostate on 05/29/2020.  The prostate volume measured 43.7 cc.  Out of 12 core biopsies, 3 were positive.  The maximum Gleason score was 3+4, and this was seen in the right apex lateral and right apex (small focus). Additionally, Gleason 3+3 was noted in the left apex.  The patient reviewed the biopsy results with his urologist and he has kindly been referred today for discussion of potential radiation treatment options.   PREVIOUS RADIATION THERAPY: No  PAST MEDICAL HISTORY:  Past Medical History:  Diagnosis Date  . Hypertension   . Left knee DJD   . Obesity (BMI  30-39.9)       PAST SURGICAL HISTORY: Past Surgical History:  Procedure Laterality Date  . CORNEAL TRANSPLANT  1999  . EYE SURGERY    . FINGER SURGERY  1984   enchondroma  . JOINT REPLACEMENT    . KNEE ARTHROSCOPY WITH MEDIAL MENISECTOMY  1987   right  . MENISECTOMY  1973   left knee  . NECK MASS EXCISION  2008   lipoma  . TOTAL KNEE ARTHROPLASTY  12/03/2012   Procedure: TOTAL KNEE ARTHROPLASTY;  Surgeon: Lorn Junes, MD;  Location: [facility];  Service: Orthopedics;  Laterality: Left;  Left Knee Arthroplasty Lateral and Medial Compartments with patella Resurfacing    FAMILY HISTORY:  Family History  Problem Relation Age of Onset  . Heart attack Maternal Grandfather   . Cancer - Other Paternal Grandfather        pancreatic    SOCIAL HISTORY:  Social History   Socioeconomic History  . Marital status: Married    Spouse name: Not on file  . Number of children: Not on file  . Years of education: Not on file  . Highest education level: Not on file  Occupational History  . Not on file  Tobacco Use  . Smoking status: Never Smoker  . Tobacco comment: social drinker  Substance and Sexual Activity  . Alcohol use: Yes    Comment: social  . Drug use: No  . Sexual activity: Not on file  Other Topics Concern  . Not on file  Social History Narrative  . Not on file   Social Determinants of Health   Financial Resource Strain:   . Difficulty of Paying Living Expenses:   Food Insecurity:   . Worried About Charity fundraiser in the Last Year:   . Arboriculturist in the Last Year:   Transportation Needs:   . Film/video editor (Medical):   Marland Kitchen Lack of Transportation (Non-Medical):   Physical Activity:   . Days of Exercise per Week:   . Minutes of Exercise per Session:   Stress:   . Feeling of Stress :   Social Connections:   . Frequency of Communication with Friends and Family:   . Frequency of Social Gatherings with Friends and Family:   . Attends Religious  Services:   . Active Member of Clubs or Organizations:   . Attends Archivist Meetings:   Marland Kitchen Marital Status:   Intimate Partner Violence:   . Fear of Current or Ex-Partner:   . Emotionally Abused:   Marland Kitchen Physically Abused:   . Sexually Abused:     ALLERGIES: Patient has no known allergies.  MEDICATIONS:  Current Outpatient Medications  Medication Sig Dispense Refill  . acetaminophen (TYLENOL) 500 MG tablet Take 2 tablets (1,000 mg total) by mouth every 6 (six) hours as needed for pain or fever. For pain 100 tablet 0  . amLODipine-olmesartan (AZOR) 5-20 MG tablet Take 1 tablet by mouth daily.    . tamsulosin (FLOMAX) 0.4 MG CAPS capsule Take 0.4 mg by mouth daily.    . celecoxib (CELEBREX) 200 MG capsule 1 tablet daily with food for pain and swelling. (Patient not taking: Reported on 12/05/2014) 30 capsule 0  . docusate sodium 100 MG CAPS 1 tab 2 times a day while on narcotics.  STOOL SOFTENER (Patient not taking: Reported on 12/05/2014) 60 capsule 0  . enoxaparin (LOVENOX) 30 MG/0.3ML injection Inject 0.3 mLs (30 mg total) into the skin every 12 (twelve) hours. (Patient not taking: Reported on 12/05/2014) 30 Syringe 0  . oxyCODONE (OXY IR/ROXICODONE) 5 MG immediate release tablet 1-2 tablets every 4-6 hrs as needed for pain (Patient not taking: Reported on 12/05/2014) 100 tablet 0  . valsartan (DIOVAN) 160 MG tablet Take 160 mg by mouth daily. (Patient not taking: Reported on 08/04/2020)     No current facility-administered medications for this encounter.    REVIEW OF SYSTEMS:  On review of systems, the patient reports that he is doing well overall. He denies any chest pain, shortness of breath, cough, fevers, chills, night sweats, unintended weight changes. He denies any bowel disturbances, and denies abdominal pain, nausea or vomiting. He denies any new musculoskeletal or joint aches or pains.  His IPSS score was 9, indicating mild to moderate urinary symptoms with frequency,  urgency and rare urge incontinence.  His shim score was 2, indicating severe erectile dysfunction which he reports is not a high priority at present. A complete review of systems is obtained and is otherwise negative.    PHYSICAL EXAM:  Wt Readings from Last 3 Encounters:  12/05/14 262 lb (118.8 kg)  11/26/12 261 lb 14.5 oz (118.8 kg)  11/19/12 260 lb (117.9 kg)   Temp Readings from Last 3 Encounters:  12/05/14 98.4 F (36.9 C) (Oral)  12/04/12 97.6 F (36.4 C) (Oral)  11/26/12 98.6 F (37 C) (Oral)   BP Readings from Last 3 Encounters:  12/05/14 (!) 200/110  12/04/12 (!) 175/79  11/26/12 (!) 174/115   Pulse  Readings from Last 3 Encounters:  12/05/14 87  12/04/12 90  11/26/12 81    /10  Physical exam not performed in light of telephone consult visit format.   KPS = 100  100 - Normal; no complaints; no evidence of disease. 90   - Able to carry on normal activity; minor signs or symptoms of disease. 80   - Normal activity with effort; some signs or symptoms of disease. 67   - Cares for self; unable to carry on normal activity or to do active work. 60   - Requires occasional assistance, but is able to care for most of his personal needs. 50   - Requires considerable assistance and frequent medical care. 54   - Disabled; requires special care and assistance. 59   - Severely disabled; hospital admission is indicated although death not imminent. 29   - Very sick; hospital admission necessary; active supportive treatment necessary. 10   - Moribund; fatal processes progressing rapidly. 0     - Dead  Karnofsky DA, Abelmann [place], Craver LS and Burchenal [facility] [phone]) The use of the nitrogen mustards in the palliative treatment of carcinoma: with particular reference to bronchogenic carcinoma Cancer 1 634-56  LABORATORY DATA:  Lab Results  Component Value Date   WBC 15.8 (H) 12/04/2012   HGB 12.9 (L) 12/04/2012   HCT 38.2 (L) 12/04/2012   MCV 95.5 12/04/2012   PLT 152 12/04/2012    Lab Results  Component Value Date   NA 142 12/04/2012   K 3.8 12/04/2012   CL 104 12/04/2012   CO2 25 12/04/2012   Lab Results  Component Value Date   ALT 27 11/26/2012   AST 27 11/26/2012   ALKPHOS 60 11/26/2012   BILITOT 1.2 11/26/2012     RADIOGRAPHY: No results found.    IMPRESSION/PLAN: This visit was conducted via Telephone to spare the patient unnecessary potential exposure in the healthcare setting during the current COVID-19 pandemic. 1. 64 y.o. gentleman with Stage T1c adenocarcinoma of the prostate with Gleason Score of 3+4, and PSA of 6.7. We discussed the patient's workup and outlined the nature of prostate cancer in this setting. The patient's T stage, Gleason's score, and PSA put him into the favorable intermediate risk group. Accordingly, he is eligible for a variety of potential treatment options including brachytherapy, 5.5 weeks of external radiation, or prostatectomy. We discussed the available radiation techniques, and focused on the details and logistics of delivery. We discussed and outlined the risks, benefits, short and long-term effects associated with radiotherapy and compared and contrasted these with prostatectomy. We discussed the role of SpaceOAR gel in reducing the rectal toxicity associated with radiotherapy.  He was encouraged to ask questions that were answered to his stated satisfaction.  He appears to have a good understanding of his disease and our treatment recommendations which are of curative intent.  At the end of the conversation, the patient is interested in moving forward with brachytherapy and use of SpaceOAR to reduce rectal toxicity from radiotherapy.  We will share our discussion with Dr. Alinda Money and move forward with scheduling his CT [facility] planning appointment in the near future. He notes he has a grandchild on the way and would like to proceed with the procedure as soon as possible. The patient will be contacted by Romie Jumper in our office  who will be working closely with him to coordinate OR scheduling and pre and post procedure appointments.  We will contact the pharmaceutical rep to ensure  that [place] is available at the time of procedure.  We enjoyed meeting him today and look forward to continuing to participate in his care.  Given current concerns for patient exposure during the COVID-19 pandemic, this encounter was conducted via telephone. The patient was notified in advance and was offered a MyChart meeting to allow for face to face communication but unfortunately reported that he did not have the appropriate resources/technology to support such a visit and instead preferred to proceed with telephone consult. The patient has given verbal consent for this type of encounter. The time spent during this encounter was 45 minutes. The attendants for this meeting include Tyler Pita MD, Ashlyn Bruning PA-C, [place], and patient, SAIGE BUSBY. During the encounter, Tyler Pita MD, Ashlyn Bruning PA-C, and scribe, Wilburn Mylar were located at [place].  Patient, ASHVIK GRUNDMAN was located at home.    Nicholos Johns, PA-C    Tyler Pita, MD  [facility] Direct Dial: [phone]  Fax: [phone] [email or web address]  Skype  LinkedIn  This document serves as a record of services personally performed by Tyler Pita, MD and Freeman Caldron, PA-C. It was created on their behalf by Wilburn Mylar, a trained medical scribe. The creation of this record is based on the scribe's personal observations and the provider's statements to them. This document has been checked and approved by the attending provider.

## 2020-08-04 NOTE — Telephone Encounter (Signed)
CALLED PATIENT TO ASK QUESTIONS, SPOKE WITH PATIENT 

## 2020-08-04 NOTE — Progress Notes (Signed)
GU Location of Tumor / Histology: prostatic adenocarcinoma  If Prostate Cancer, Gleason Score is (3 + 4) and PSA is (6.7). Prostate volume:43.7  Louis Vasquez presented with an elevated PSA of 6.7.  Biopsies of prostate (if applicable) revealed:    Past/Anticipated interventions by urology, if any: prostate biopsy, prescribed tamsulosin, referred to Dr. Tammi Klippel to discuss brachytherapy  Past/Anticipated interventions by medical oncology, if any: no  Weight changes, if any: no  Bowel/Bladder complaints, if any: Reports urinary urgency and rare urge incontinence. Reports erectile dysfunction.    Nausea/Vomiting, if any: no  Pain issues, if any:   No   SAFETY ISSUES:  Prior radiation? no  Pacemaker/ICD? no  Possible current pregnancy? no, male patient  Is the patient on methotrexate? no  Current Complaints / other details:  64 year old male. Married with 3 daughters. Father with history of liver ca.

## 2020-08-05 ENCOUNTER — Telehealth: Payer: Self-pay | Admitting: *Deleted

## 2020-08-05 NOTE — Telephone Encounter (Signed)
CALLED PATIENT TO UPDATE, LVM FOR A RETURN CALL 

## 2020-08-11 ENCOUNTER — Encounter: Payer: Self-pay | Admitting: Medical Oncology

## 2020-09-09 ENCOUNTER — Telehealth: Payer: Self-pay | Admitting: *Deleted

## 2020-09-09 NOTE — Telephone Encounter (Signed)
CALLED PATIENT TO REMIND OF PRE-SEED APPTS. FOR 09-10-20, SPOKE WITH PATIENT AND HE IS AWARE OF THESE APPTS.

## 2020-09-10 ENCOUNTER — Encounter: Payer: Self-pay | Admitting: Medical Oncology

## 2020-09-10 ENCOUNTER — Ambulatory Visit
Admission: RE | Admit: 2020-09-10 | Discharge: 2020-09-10 | Disposition: A | Discharge status: Home / Self Care | Payer: BC Managed Care – PPO | Source: Ambulatory Visit | Attending: Radiation Oncology | Admitting: Radiation Oncology

## 2020-09-10 ENCOUNTER — Encounter: Payer: Self-pay | Admitting: Urology

## 2020-09-10 ENCOUNTER — Ambulatory Visit
Admission: RE | Admit: 2020-09-10 | Discharge: 2020-09-10 | Disposition: A | Discharge status: Home / Self Care | Payer: BC Managed Care – PPO | Source: Ambulatory Visit | Attending: Urology | Admitting: Urology

## 2020-09-10 ENCOUNTER — Other Ambulatory Visit: Payer: Self-pay

## 2020-09-10 DIAGNOSIS — C61 Malignant neoplasm of prostate: Secondary | ICD-10-CM | POA: Insufficient documentation

## 2020-09-10 DIAGNOSIS — Z510 Encounter for antineoplastic radiation therapy: Secondary | ICD-10-CM | POA: Insufficient documentation

## 2020-09-10 NOTE — Progress Notes (Signed)
  Radiation Oncology         [phone] ________________________________  Name: Louis Vasquez MRN: [id number]  Date: 09/10/2020  DOB: 12-26-1955  SIMULATION AND TREATMENT PLANNING NOTE PUBIC ARCH STUDY  MB:[id number], No Pcp Per  Raynelle Bring, MD  DIAGNOSIS: 64 y.o. gentleman with Stage T1c adenocarcinoma of the prostate with Gleason score of 3+4, and PSA of 6.7.  Oncology History  Malignant neoplasm of prostate ([place])  05/29/2020 Cancer Staging   Staging form: Prostate, AJCC 8th Edition - Clinical stage from 05/29/2020: Stage IIB (cT1c, cN0, cM0, PSA: 6.7, Grade Group: 2) - Signed by Freeman Caldron, PA-C on 08/04/2020   08/04/2020 Initial Diagnosis   Malignant neoplasm of prostate ([place])       ICD-10-CM   1. Malignant neoplasm of prostate ([place])  C61     COMPLEX SIMULATION:  The patient presented today for evaluation for possible prostate seed implant. He was brought to the radiation planning suite and placed supine on the CT couch. A 3-dimensional image study set was obtained in upload to the planning computer. There, on each axial slice, I contoured the prostate gland. Then, using three-dimensional radiation planning tools I reconstructed the prostate in view of the structures from the transperineal needle pathway to assess for possible pubic arch interference. In doing so, I did not appreciate any pubic arch interference. Also, the patient's prostate volume was estimated based on the drawn structure. The volume was 42 cc.  Given the pubic arch appearance and prostate volume, patient remains a good candidate to proceed with prostate seed implant. Today, he freely provided informed written consent to proceed.    PLAN: The patient will undergo prostate seed implant.   ________________________________  Sheral Apley. Tammi Klippel, M.D.

## 2020-09-10 NOTE — Progress Notes (Signed)
At the time of his preseed appointment, the patient mentions that we are still awaiting a date for the procedure and that if it happens to fall after the first 3 weeks of October, he would prefer to wait and have the procedure in January, after the holidays since he is expecting a new grandbaby in November.  We discussed that given his favorable intermediate risk disease, this would not be an issue.  Our only recommendation would be to have a repeat PSA with Dr. Alinda Money sometime in November or December, prior to the procedure so that we have an accurate baseline pretreatment PSA.  He was comfortable and in agreement with the stated plan.  We are hopeful that we can get him on the schedule in the next several weeks and have this procedure out of the way this year as opposed to waiting until next but we will play it by ear.  Louis Vasquez, MMS, PA-C [place] at [place]: [phone]  Fax: [phone]

## 2020-09-14 ENCOUNTER — Telehealth: Payer: Self-pay | Admitting: *Deleted

## 2020-09-14 ENCOUNTER — Other Ambulatory Visit: Payer: Self-pay | Admitting: Urology

## 2020-09-14 NOTE — Telephone Encounter (Signed)
CALLED PATIENT TO UPDATE, LVM FOR A RETURN CALL 

## 2020-09-17 ENCOUNTER — Telehealth: Payer: Self-pay | Admitting: *Deleted

## 2020-09-17 NOTE — Telephone Encounter (Signed)
CALLED PATIENT TO INFORM THAT IT WOULD BE OK TO HAVE HIS IMPLANT NEXT YEAR, I EXPLAINED THAT WANTS HIM TO HAVE A REPEAT PSA SOMETIME IN NOV. OR DEC. - PATIENT HAS REQUESTED NOV. 3 @ [facility], I AM ARRANGING THIS APPT. FOR THIS PT., PATIENT VERIFIED UNDERSTANDING THIS

## 2020-09-23 ENCOUNTER — Telehealth: Payer: Self-pay | Admitting: *Deleted

## 2020-09-23 NOTE — Telephone Encounter (Signed)
Called patient to inform of lab appt. @ [facility] on 10-21-20 - anytime between 8 am and 4 pm, lvm for a return call

## 2020-10-21 DIAGNOSIS — C61 Malignant neoplasm of prostate: Secondary | ICD-10-CM | POA: Diagnosis not present

## 2020-11-16 DIAGNOSIS — H18513 Endothelial corneal dystrophy, bilateral: Secondary | ICD-10-CM | POA: Diagnosis not present

## 2020-11-16 DIAGNOSIS — Z947 Corneal transplant status: Secondary | ICD-10-CM | POA: Diagnosis not present

## 2020-11-16 DIAGNOSIS — H52213 Irregular astigmatism, bilateral: Secondary | ICD-10-CM | POA: Diagnosis not present

## 2020-11-27 ENCOUNTER — Telehealth: Payer: Self-pay | Admitting: *Deleted

## 2020-11-27 NOTE — Telephone Encounter (Signed)
RETURNED PATIENT'S PHONE CALL, SPOKE WITH PATIENT. ?

## 2020-12-01 ENCOUNTER — Ambulatory Visit
Admission: RE | Admit: 2020-12-01 | Discharge: 2020-12-01 | Disposition: A | Discharge status: Home / Self Care | Payer: BC Managed Care – PPO | Source: Ambulatory Visit | Attending: Internal Medicine | Admitting: Internal Medicine

## 2020-12-01 ENCOUNTER — Other Ambulatory Visit: Payer: Self-pay | Admitting: Internal Medicine

## 2020-12-01 DIAGNOSIS — Z0000 Encounter for general adult medical examination without abnormal findings: Secondary | ICD-10-CM | POA: Diagnosis not present

## 2020-12-01 DIAGNOSIS — I10 Essential (primary) hypertension: Secondary | ICD-10-CM | POA: Diagnosis not present

## 2020-12-01 DIAGNOSIS — E782 Mixed hyperlipidemia: Secondary | ICD-10-CM | POA: Diagnosis not present

## 2020-12-01 DIAGNOSIS — H6123 Impacted cerumen, bilateral: Secondary | ICD-10-CM | POA: Diagnosis not present

## 2020-12-01 DIAGNOSIS — Z01818 Encounter for other preprocedural examination: Secondary | ICD-10-CM | POA: Diagnosis not present

## 2020-12-01 DIAGNOSIS — E559 Vitamin D deficiency, unspecified: Secondary | ICD-10-CM | POA: Diagnosis not present

## 2020-12-01 DIAGNOSIS — C61 Malignant neoplasm of prostate: Secondary | ICD-10-CM | POA: Diagnosis not present

## 2020-12-02 DIAGNOSIS — H18513 Endothelial corneal dystrophy, bilateral: Secondary | ICD-10-CM | POA: Diagnosis not present

## 2020-12-04 DIAGNOSIS — M545 Low back pain, unspecified: Secondary | ICD-10-CM | POA: Diagnosis not present

## 2020-12-04 DIAGNOSIS — M1611 Unilateral primary osteoarthritis, right hip: Secondary | ICD-10-CM | POA: Diagnosis not present

## 2020-12-04 DIAGNOSIS — S83412A Sprain of medial collateral ligament of left knee, initial encounter: Secondary | ICD-10-CM | POA: Diagnosis not present

## 2020-12-14 DIAGNOSIS — M1611 Unilateral primary osteoarthritis, right hip: Secondary | ICD-10-CM | POA: Diagnosis not present

## 2020-12-28 DIAGNOSIS — S83412A Sprain of medial collateral ligament of left knee, initial encounter: Secondary | ICD-10-CM | POA: Diagnosis not present

## 2021-01-01 ENCOUNTER — Other Ambulatory Visit (HOSPITAL_COMMUNITY): Payer: BC Managed Care – PPO

## 2021-01-01 ENCOUNTER — Other Ambulatory Visit: Payer: Self-pay | Admitting: Urology

## 2021-01-04 ENCOUNTER — Telehealth: Payer: Self-pay | Admitting: *Deleted

## 2021-01-04 NOTE — Telephone Encounter (Signed)
Returned patient's phone call, spoke with patient 

## 2021-01-06 ENCOUNTER — Telehealth: Payer: Self-pay | Admitting: *Deleted

## 2021-01-06 NOTE — Telephone Encounter (Signed)
CALLED PATIENT TO INFORM THAT I HAVE RECEIVED HIS EKG, SPOKE WITH PATIENT AND HE IS AWARE.

## 2021-01-07 ENCOUNTER — Other Ambulatory Visit (HOSPITAL_COMMUNITY)
Admission: RE | Admit: 2021-01-07 | Discharge: 2021-01-07 | Disposition: A | Discharge status: Home / Self Care | Payer: BC Managed Care – PPO | Source: Ambulatory Visit | Attending: Urology | Admitting: Urology

## 2021-01-07 ENCOUNTER — Other Ambulatory Visit: Payer: Self-pay

## 2021-01-07 ENCOUNTER — Encounter (HOSPITAL_BASED_OUTPATIENT_CLINIC_OR_DEPARTMENT_OTHER): Payer: Self-pay | Admitting: Urology

## 2021-01-07 ENCOUNTER — Encounter (HOSPITAL_COMMUNITY)
Admission: RE | Admit: 2021-01-07 | Discharge: 2021-01-07 | Disposition: A | Discharge status: Home / Self Care | Payer: BC Managed Care – PPO | Source: Ambulatory Visit | Attending: Urology | Admitting: Urology

## 2021-01-07 DIAGNOSIS — Z20822 Contact with and (suspected) exposure to covid-19: Secondary | ICD-10-CM | POA: Diagnosis not present

## 2021-01-07 DIAGNOSIS — Z01812 Encounter for preprocedural laboratory examination: Secondary | ICD-10-CM | POA: Diagnosis not present

## 2021-01-07 LAB — COMPREHENSIVE METABOLIC PANEL
ALT: 44 U/L (ref 0–44)
AST: 38 U/L (ref 15–41)
Albumin: 4.5 g/dL (ref 3.5–5.0)
Alkaline Phosphatase: 54 U/L (ref 38–126)
Anion gap: 13 (ref 5–15)
BUN: 13 mg/dL (ref 8–23)
CO2: 24 mmol/L (ref 22–32)
Calcium: 9.3 mg/dL (ref 8.9–10.3)
Chloride: 102 mmol/L (ref 98–111)
Creatinine, Ser: 0.89 mg/dL (ref 0.61–1.24)
GFR, Estimated: >60 mL/min (ref >60)
Glucose, Bld: 111 mg/dL — ABNORMAL HIGH (ref 70–99)
Potassium: 3.6 mmol/L (ref 3.5–5.1)
Sodium: 139 mmol/L (ref 135–145)
Total Bilirubin: 1.6 mg/dL — ABNORMAL HIGH (ref 0.3–1.2)
Total Protein: 6.9 g/dL (ref 6.5–8.1)

## 2021-01-07 LAB — CBC
HCT: 47.8 % (ref 39.0–52.0)
Hemoglobin: 16.5 g/dL (ref 13.0–17.0)
MCH: 32.9 pg (ref 26.0–34.0)
MCHC: 34.5 g/dL (ref 30.0–36.0)
MCV: 95.2 fL (ref 80.0–100.0)
Platelets: 166 10*3/uL (ref 150–400)
RBC: 5.02 MIL/uL (ref 4.22–5.81)
RDW: 12.9 % (ref 11.5–15.5)
WBC: 7.1 10*3/uL (ref 4.0–10.5)
nRBC: 0 % (ref 0.0–0.2)

## 2021-01-07 LAB — APTT: aPTT: 27 seconds (ref 24–36)

## 2021-01-07 LAB — PROTIME-INR
INR: 1.1 (ref 0.8–1.2)
Prothrombin Time: 13.4 seconds (ref 11.4–15.2)

## 2021-01-07 NOTE — Progress Notes (Signed)
Patient bp 184/110 at lab appointment today, Janett Billow zanetto pa aware of elevated blood pressure and patient instructed to call primary md about elevated blood pressure, patient called primary md while at lab appointment today about elevated blood pressure.

## 2021-01-07 NOTE — Progress Notes (Signed)
Spoke w/ via phone for pre-op interview---pt Lab needs dos---- lab appt 01-07-2021 230 pm for cbc, cmet, pt, ptt              Lab results------ekg dr Herbie [place] gates 12-01-2020 on chart chest xray 12-01-2020 epic COVID test ------01-07-2021 at 1300 pm Arrive at -------530 am 01-11-2021  NPO after MN NO Solid Food.  Clear liquids from MN until---430 am then npo Medications to take morning of surgery -----tamsulosin Diabetic medication -----n/a Patient Special Instructions -----fleets enema am of surgery Pre-Op special Istructions -----none Patient verbalized understanding of instructions that were given at this phone interview. Patient denies shortness of breath, chest pain, fever, cough at this phone interview.

## 2021-01-08 ENCOUNTER — Telehealth: Payer: Self-pay | Admitting: *Deleted

## 2021-01-08 LAB — SARS CORONAVIRUS 2 (TAT 6-24 HRS): SARS Coronavirus 2: NEGATIVE

## 2021-01-08 NOTE — Telephone Encounter (Signed)
CALLED PATIENT TO REMIND OF PROCEDURE FOR 01-11-21, SPOKE WITH PATIENT AND HE IS AWARE OF THIS PROCEDURE

## 2021-01-08 NOTE — H&P (Signed)
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 This confirmed Gleason 3+4=7 adenocarcinoma of the prostate with 3 out of 12 biopsy cores positive for malignancy.   Family history: None.   Imaging studies: None.   PMH: He has a history of arthritis and hypertension.  PSH: No abdominal surgeries.   TNM stage: cT1c Nx Mx  PSA: 6.7  Gleason score: 3+4=7 (GG 2)  Biopsy (05/29/20): 3/12 cores positive  Left: L apex (10%, 3+3=6)  Right: R apex (5%, 3+4=7), R lateral apex (10%, 3+4=7)  Prostate volume: 43.7  PSAD: 0.15   Nomogram  OC disease: 66%  EPE: 32%  SVI: 3%  LNI: 3%  PFS (5 year, 10 year): 86%, 76%   Urinary function: IPSS is 4. He takes tamsulosin and has found this to be mildly helpful. His main symptom is urinary urgency and rare urge incontinence which is likely treatable with even behavioral therapy.  Erectile function: He does have severe erectile dysfunction. He has not been treated. This has been a lower priority for the last 10 years.     ALLERGIES: No Allergies    MEDICATIONS: Levofloxacin 750 mg tablet Please take one tablet the morning of your biopsy.  Tamsulosin Hcl  Amlodipine Besylate     GU PSH: Prostate Needle Biopsy - 05/29/2020       PSH Notes: Cornea Transplant 1997   NON-GU PSH: Knee replacement, Bilateral Surgical Pathology, Gross And Microscopic Examination For Prostate Needle - 05/29/2020     GU PMH: BPH w/LUTS - 03/25/2020 ED due to arterial insufficiency - 03/25/2020 Elevated PSA - 03/25/2020 Urinary Urgency - 03/25/2020    NON-GU PMH: Arthritis Hypertension    FAMILY HISTORY: Emphysema - Mother liver cancer - Father    Notes: 3 daughters   SOCIAL HISTORY: Marital Status: Married Preferred Language: English; Ethnicity: Not Hispanic Or Latino; Race: White Current Smoking Status: Patient has never smoked.   Tobacco Use  Assessment Completed: Used Tobacco in last 30 days? Drinks 2 drinks per day. Social Drinker.  Drinks 2 caffeinated drinks per day.    REVIEW OF SYSTEMS:    GU Review Male:   Patient denies frequent urination, hard to postpone urination, burning/ pain with urination, get up at night to urinate, leakage of urine, stream starts and stops, trouble starting your streams, and have to strain to urinate .  Gastrointestinal (Upper):   Patient denies nausea and vomiting.  Gastrointestinal (Lower):   Patient denies diarrhea and constipation.  Constitutional:   Patient denies fever, night sweats, weight loss, and fatigue.  Skin:   Patient denies skin rash/ lesion and itching.  Eyes:   Patient denies blurred vision and double vision.  Ears/ Nose/ Throat:   Patient denies sore throat and sinus problems.  Hematologic/Lymphatic:   Patient denies swollen glands and easy bruising.  Cardiovascular:   Patient denies leg swelling and chest pains.  Respiratory:   Patient denies shortness of breath and cough.  Endocrine:   Patient denies excessive thirst.  Musculoskeletal:   Patient denies back pain and joint pain.  Neurological:   Patient denies headaches and dizziness.  Psychologic:   Patient denies depression and anxiety.   VITAL SIGNS:     Weight 265 lb / 120.2 kg  Height 70 in / 177.8 cm  BMI 38.0 kg/m   MULTI-SYSTEM PHYSICAL EXAMINATION:    Constitutional:  Well-nourished. No physical deformities. Normally developed. Good grooming.  CV: RRR Lungs: Clear     ASSESSMENT:      ICD-10 Details  1 GU:   Prostate Cancer - C61    PLAN:      1. Favorable intermediate risk prostate cancer: He has elected to proceed with brachytherapy, cystoscopy, and SpaceOAR insertion. I discussed the potential benefits and risks of the procedure, side effects of the proposed treatment, the likelihood of the patient achieving the goals of the procedure, and any potential problems that might occur during the procedure  or recuperation.

## 2021-01-10 NOTE — Progress Notes (Signed)
  Radiation Oncology         [phone] ________________________________  Name: DEEN DEGUIA MRN: [id number]  Date: 01/10/2021  DOB: April 09, 1956       Prostate Seed Implant  LF:YBOFBP, Provider Not In  No ref. provider found  DIAGNOSIS:  65 y.o. gentleman with Stage T1c adenocarcinoma of the prostate with Gleason score of 3+4, and PSA of 6.7. Oncology History  Malignant neoplasm of prostate ([place])  05/29/2020 Cancer Staging   Staging form: Prostate, AJCC 8th Edition - Clinical stage from 05/29/2020: Stage IIB (cT1c, cN0, cM0, PSA: 6.7, Grade Group: 2) - Signed by Freeman Caldron, PA-C on 08/04/2020   08/04/2020 Initial Diagnosis   Malignant neoplasm of prostate ([facility])    PROCEDURE: Insertion of radioactive I-125 seeds into the prostate gland.  RADIATION DOSE: 145 Gy, definitive therapy.  TECHNIQUE: ZYREE TRAYNHAM was brought to the operating room with the urologist. He was placed in the dorsolithotomy position. He was catheterized and a rectal tube was inserted. The perineum was shaved, prepped and draped. The ultrasound probe was then introduced into the rectum to see the prostate gland.  TREATMENT DEVICE: A needle grid was attached to the ultrasound probe stand and anchor needles were placed.  3D PLANNING: The prostate was imaged in 3D using a sagittal sweep of the prostate probe. These images were transferred to the planning computer. There, the prostate, urethra and rectum were defined on each axial reconstructed image. Then, the software created an optimized 3D plan and a few seed positions were adjusted. The quality of the plan was reviewed using [facility] information for the target and the following two organs at risk:  Urethra and Rectum.  Then the accepted plan was printed and handed off to the radiation therapist.  Under my supervision, the custom loading of the seeds and spacers was carried out and loaded into sealed vicryl sleeves.  These pre-loaded needles were then placed into  the needle holder.Marland Kitchen  PROSTATE VOLUME STUDY:  Using transrectal ultrasound the volume of the prostate was verified to be 44.1 cc.  SPECIAL TREATMENT PROCEDURE/SUPERVISION AND HANDLING: The pre-loaded needles were then delivered under sagittal guidance. A total of 19 needles were used to deposit 69 seeds in the prostate gland. The individual seed activity was 0.507 mCi.  SpaceOAR:  Yes  COMPLEX SIMULATION: At the end of the procedure, an anterior radiograph of the pelvis was obtained to document seed positioning and count. Cystoscopy was performed to check the urethra and bladder.  MICRODOSIMETRY: At the end of the procedure, the patient was emitting 0.035 mR/hr at 1 meter. Accordingly, he was considered safe for hospital discharge.  PLAN: The patient will return to the radiation oncology clinic for post implant CT dosimetry in three weeks.   ________________________________  Sheral Apley Tammi Klippel, M.D.

## 2021-01-10 NOTE — Anesthesia Preprocedure Evaluation (Addendum)
Anesthesia Evaluation  Patient identified by MRN, date of birth, ID band Patient awake    Reviewed: Allergy & Precautions, NPO status , Patient's Chart, lab work & pertinent test results  Airway Mallampati: II  TM Distance: >3 FB Neck ROM: Full    Dental no notable dental hx. (+) Teeth Intact, Dental Advisory Given   Pulmonary neg pulmonary ROS,    Pulmonary exam normal breath sounds clear to auscultation       Cardiovascular Exercise Tolerance: Good hypertension, Pt. on medications Normal cardiovascular exam Rhythm:Regular Rate:Normal     Neuro/Psych negative neurological ROS     GI/Hepatic negative GI ROS, Neg liver ROS,   Endo/Other  negative endocrine ROS  Renal/GU negative Renal ROSK+ 3.6 Cr 0.89   Prostate CA    Musculoskeletal  (+) Arthritis ,   Abdominal (+) + obese,   Peds  Hematology negative hematology ROS (+) Hgb 16.5 Plt 166   Anesthesia Other Findings   Reproductive/Obstetrics                            Anesthesia Physical Anesthesia Plan  ASA: III  Anesthesia Plan: General   Post-op Pain Management:    Induction: Intravenous  PONV Risk Score and Plan: 3 and Treatment may vary due to age or medical condition, Ondansetron and Midazolam  Airway Management Planned: LMA  Additional Equipment: None  Intra-op Plan:   Post-operative Plan:   Informed Consent: I have reviewed the patients History and Physical, chart, labs and discussed the procedure including the risks, benefits and alternatives for the proposed anesthesia with the patient or authorized representative who has indicated his/her understanding and acceptance.     Dental advisory given  Plan Discussed with: CRNA  Anesthesia Plan Comments:        Anesthesia Quick Evaluation

## 2021-01-11 ENCOUNTER — Encounter (HOSPITAL_BASED_OUTPATIENT_CLINIC_OR_DEPARTMENT_OTHER)
Admission: RE | Disposition: A | Discharge status: Home / Self Care | Payer: Self-pay | Source: Home / Self Care | Attending: Urology

## 2021-01-11 ENCOUNTER — Ambulatory Visit (HOSPITAL_BASED_OUTPATIENT_CLINIC_OR_DEPARTMENT_OTHER): Payer: BC Managed Care – PPO | Admitting: Physician Assistant

## 2021-01-11 ENCOUNTER — Other Ambulatory Visit: Payer: Self-pay

## 2021-01-11 ENCOUNTER — Ambulatory Visit (HOSPITAL_BASED_OUTPATIENT_CLINIC_OR_DEPARTMENT_OTHER)
Admission: RE | Admit: 2021-01-11 | Discharge: 2021-01-11 | Disposition: A | Discharge status: Home / Self Care | Payer: BC Managed Care – PPO | Attending: Urology | Admitting: Urology

## 2021-01-11 ENCOUNTER — Ambulatory Visit (HOSPITAL_COMMUNITY): Payer: BC Managed Care – PPO

## 2021-01-11 ENCOUNTER — Encounter (HOSPITAL_BASED_OUTPATIENT_CLINIC_OR_DEPARTMENT_OTHER): Payer: Self-pay | Admitting: Urology

## 2021-01-11 ENCOUNTER — Ambulatory Visit (HOSPITAL_BASED_OUTPATIENT_CLINIC_OR_DEPARTMENT_OTHER): Payer: BC Managed Care – PPO | Admitting: Anesthesiology

## 2021-01-11 DIAGNOSIS — I10 Essential (primary) hypertension: Secondary | ICD-10-CM | POA: Diagnosis not present

## 2021-01-11 DIAGNOSIS — Z79899 Other long term (current) drug therapy: Secondary | ICD-10-CM | POA: Insufficient documentation

## 2021-01-11 DIAGNOSIS — Z825 Family history of asthma and other chronic lower respiratory diseases: Secondary | ICD-10-CM | POA: Diagnosis not present

## 2021-01-11 DIAGNOSIS — C61 Malignant neoplasm of prostate: Secondary | ICD-10-CM | POA: Diagnosis not present

## 2021-01-11 DIAGNOSIS — Z800 Family history of malignant neoplasm of digestive organs: Secondary | ICD-10-CM | POA: Diagnosis not present

## 2021-01-11 DIAGNOSIS — M1712 Unilateral primary osteoarthritis, left knee: Secondary | ICD-10-CM | POA: Diagnosis not present

## 2021-01-11 HISTORY — DX: Presence of spectacles and contact lenses: Z97.3

## 2021-01-11 HISTORY — PX: CYSTOSCOPY: SHX5120

## 2021-01-11 HISTORY — PX: RADIOACTIVE SEED IMPLANT: SHX5150

## 2021-01-11 HISTORY — DX: Malignant (primary) neoplasm, unspecified: C80.1

## 2021-01-11 HISTORY — PX: SPACE OAR INSTILLATION: SHX6769

## 2021-01-11 SURGERY — INSERTION, RADIATION SOURCE, PROSTATE
Anesthesia: General | Site: Rectum

## 2021-01-11 MED ORDER — ACETAMINOPHEN 10 MG/ML IV SOLN: 1000.0000 mg | Freq: Once | INTRAVENOUS | Status: DC | PRN

## 2021-01-11 MED ORDER — LACTATED RINGERS IV SOLN: INTRAVENOUS | Status: DC

## 2021-01-11 MED ORDER — CIPROFLOXACIN IN D5W 400 MG/200ML IV SOLN
INTRAVENOUS | Status: AC
  Filled 2021-01-11: qty 200

## 2021-01-11 MED ORDER — DEXAMETHASONE SODIUM PHOSPHATE 10 MG/ML IJ SOLN
INTRAMUSCULAR | Status: AC
  Filled 2021-01-11: qty 1

## 2021-01-11 MED ORDER — DOCUSATE SODIUM 100 MG PO CAPS: 100.0000 mg | ORAL_CAPSULE | Freq: Two times a day (BID) | ORAL | 0 refills | Status: AC

## 2021-01-11 MED ORDER — PROPOFOL 10 MG/ML IV BOLUS
INTRAVENOUS | Status: AC
  Filled 2021-01-11: qty 40

## 2021-01-11 MED ORDER — OXYCODONE HCL 5 MG PO TABS: 5.0000 mg | ORAL_TABLET | Freq: Once | ORAL | Status: DC | PRN

## 2021-01-11 MED ORDER — PROPOFOL 10 MG/ML IV BOLUS
INTRAVENOUS | Status: DC | PRN
  Administered 2021-01-11: 40 mg via INTRAVENOUS
  Administered 2021-01-11: 200 mg via INTRAVENOUS

## 2021-01-11 MED ORDER — SODIUM CHLORIDE (PF) 0.9 % IJ SOLN
INTRAMUSCULAR | Status: DC | PRN
  Administered 2021-01-11: 5 mL via INTRAVENOUS

## 2021-01-11 MED ORDER — IOHEXOL 300 MG/ML  SOLN
INTRAMUSCULAR | Status: DC | PRN
  Administered 2021-01-11: 7 mL via URETHRAL

## 2021-01-11 MED ORDER — GLYCOPYRROLATE PF 0.2 MG/ML IJ SOSY
PREFILLED_SYRINGE | INTRAMUSCULAR | Status: AC
  Filled 2021-01-11: qty 1

## 2021-01-11 MED ORDER — ONDANSETRON HCL 4 MG/2ML IJ SOLN
INTRAMUSCULAR | Status: DC | PRN
  Administered 2021-01-11: 4 mg via INTRAVENOUS

## 2021-01-11 MED ORDER — LIDOCAINE HCL (CARDIAC) PF 100 MG/5ML IV SOSY
PREFILLED_SYRINGE | INTRAVENOUS | Status: DC | PRN
  Administered 2021-01-11: 10 mg via INTRAVENOUS
  Administered 2021-01-11: 100 mg via INTRAVENOUS

## 2021-01-11 MED ORDER — ONDANSETRON HCL 4 MG/2ML IJ SOLN
INTRAMUSCULAR | Status: AC
  Filled 2021-01-11: qty 2

## 2021-01-11 MED ORDER — GLYCOPYRROLATE 0.2 MG/ML IJ SOLN
INTRAMUSCULAR | Status: DC | PRN
  Administered 2021-01-11: .2 mg via INTRAVENOUS

## 2021-01-11 MED ORDER — EPHEDRINE SULFATE 50 MG/ML IJ SOLN
INTRAMUSCULAR | Status: DC | PRN
  Administered 2021-01-11: 10 mg via INTRAVENOUS
  Administered 2021-01-11 (×2): 20 mg via INTRAVENOUS

## 2021-01-11 MED ORDER — TRAMADOL HCL 50 MG PO TABS: 50.0000 mg | ORAL_TABLET | Freq: Four times a day (QID) | ORAL | 0 refills | Status: AC | PRN

## 2021-01-11 MED ORDER — FENTANYL CITRATE (PF) 100 MCG/2ML IJ SOLN
INTRAMUSCULAR | Status: AC
  Filled 2021-01-11: qty 2

## 2021-01-11 MED ORDER — FLEET ENEMA 7-19 GM/118ML RE ENEM: 1.0000 | ENEMA | Freq: Once | RECTAL | Status: DC

## 2021-01-11 MED ORDER — OXYCODONE HCL 5 MG/5ML PO SOLN: 5.0000 mg | Freq: Once | ORAL | Status: DC | PRN

## 2021-01-11 MED ORDER — CIPROFLOXACIN IN D5W 400 MG/200ML IV SOLN
400.0000 mg | INTRAVENOUS | Status: AC
  Administered 2021-01-11: 400 mg via INTRAVENOUS

## 2021-01-11 MED ORDER — PHENYLEPHRINE 40 MCG/ML (10ML) SYRINGE FOR IV PUSH (FOR BLOOD PRESSURE SUPPORT)
PREFILLED_SYRINGE | INTRAVENOUS | Status: AC
  Filled 2021-01-11: qty 10

## 2021-01-11 MED ORDER — MIDAZOLAM HCL 2 MG/2ML IJ SOLN
INTRAMUSCULAR | Status: AC
  Filled 2021-01-11: qty 2

## 2021-01-11 MED ORDER — SODIUM CHLORIDE 0.9 % IR SOLN
Status: DC | PRN
Start: 2021-01-11 — End: 2021-01-11
  Administered 2021-01-11: 200 mL

## 2021-01-11 MED ORDER — MIDAZOLAM HCL 5 MG/5ML IJ SOLN
INTRAMUSCULAR | Status: DC | PRN
  Administered 2021-01-11: 2 mg via INTRAVENOUS

## 2021-01-11 MED ORDER — EPHEDRINE 5 MG/ML INJ
INTRAVENOUS | Status: AC
  Filled 2021-01-11: qty 10

## 2021-01-11 MED ORDER — HYDROMORPHONE HCL 1 MG/ML IJ SOLN: 0.2500 mg | INTRAMUSCULAR | Status: DC | PRN

## 2021-01-11 MED ORDER — FENTANYL CITRATE (PF) 100 MCG/2ML IJ SOLN
INTRAMUSCULAR | Status: DC | PRN
  Administered 2021-01-11: 25 ug via INTRAVENOUS
  Administered 2021-01-11: 50 ug via INTRAVENOUS
  Administered 2021-01-11: 25 ug via INTRAVENOUS
  Administered 2021-01-11 (×2): 50 ug via INTRAVENOUS

## 2021-01-11 MED ORDER — STERILE WATER FOR IRRIGATION IR SOLN
Status: DC | PRN
  Administered 2021-01-11: 3 mL

## 2021-01-11 MED ORDER — LIDOCAINE HCL (PF) 2 % IJ SOLN
INTRAMUSCULAR | Status: AC
  Filled 2021-01-11: qty 5

## 2021-01-11 MED ORDER — ONDANSETRON HCL 4 MG/2ML IJ SOLN: 4.0000 mg | Freq: Once | INTRAMUSCULAR | Status: DC | PRN

## 2021-01-11 MED ORDER — PHENYLEPHRINE HCL (PRESSORS) 10 MG/ML IV SOLN
INTRAVENOUS | Status: DC | PRN
  Administered 2021-01-11 (×4): 40 ug via INTRAVENOUS

## 2021-01-11 SURGICAL SUPPLY — 46 items
BAG DRAIN URO-CYSTO SKYTR STRL (DRAIN) IMPLANT
BAG URINE DRAIN 2000ML AR STRL (UROLOGICAL SUPPLIES) ×4 IMPLANT
BLADE CLIPPER SENSICLIP SURGIC (BLADE) ×4 IMPLANT
CATH FOLEY 2WAY SLVR  5CC 16FR (CATHETERS) ×4
CATH FOLEY 2WAY SLVR 5CC 16FR (CATHETERS) ×3 IMPLANT
CATH ROBINSON RED A/P 16FR (CATHETERS) IMPLANT
CATH ROBINSON RED A/P 20FR (CATHETERS) ×4 IMPLANT
CLOTH BEACON ORANGE TIMEOUT ST (SAFETY) ×4 IMPLANT
CNTNR URN SCR LID CUP LEK RST (MISCELLANEOUS) ×6 IMPLANT
CONT SPEC 4OZ STRL OR WHT (MISCELLANEOUS) ×8
COVER BACK TABLE 60X90IN (DRAPES) ×4 IMPLANT
COVER MAYO STAND STRL (DRAPES) ×4 IMPLANT
DRAPE C-ARM 35X43 STRL (DRAPES) IMPLANT
DRSG TEGADERM 4X4.75 (GAUZE/BANDAGES/DRESSINGS) ×8 IMPLANT
DRSG TEGADERM 8X12 (GAUZE/BANDAGES/DRESSINGS) ×4 IMPLANT
GAUZE SPONGE 4X4 12PLY STRL LF (GAUZE/BANDAGES/DRESSINGS) ×4 IMPLANT
GLOVE BIO SURGEON STRL SZ7.5 (GLOVE) ×4 IMPLANT
GLOVE BIO SURGEON STRL SZ8 (GLOVE) IMPLANT
GLOVE BIOGEL PI ORTHO PRO 7.5 (GLOVE) ×3
GLOVE PI ORTHO PRO STRL 7.5 (GLOVE) ×9 IMPLANT
GLOVE SURG ENC MOIS LTX SZ6.5 (GLOVE) ×4 IMPLANT
GLOVE SURG ORTHO 8.5 STRL (GLOVE) ×8 IMPLANT
GLOVE SURG SS PI 6.5 STRL IVOR (GLOVE) IMPLANT
GLOVE SURG SS PI 7.0 STRL IVOR (GLOVE) ×8 IMPLANT
GOWN STRL REUS W/TWL LRG LVL3 (GOWN DISPOSABLE) ×16 IMPLANT
GOWN STRL REUS W/TWL XL LVL3 (GOWN DISPOSABLE) ×4 IMPLANT
HOLDER FOLEY CATH W/STRAP (MISCELLANEOUS) IMPLANT
I-SEED AGX100 ×276 IMPLANT
IMPL SPACEOAR VUE SYSTEM (Spacer) ×3 IMPLANT
IMPLANT SPACEOAR VUE SYSTEM (Spacer) ×4 IMPLANT
IV NS 1000ML (IV SOLUTION)
IV NS 1000ML BAXH (IV SOLUTION) IMPLANT
KIT TURNOVER CYSTO (KITS) ×4 IMPLANT
MANIFOLD NEPTUNE II (INSTRUMENTS) IMPLANT
MARKER SKIN DUAL TIP RULER LAB (MISCELLANEOUS) ×4 IMPLANT
NS IRRIG 500ML POUR BTL (IV SOLUTION) IMPLANT
PACK CYSTO (CUSTOM PROCEDURE TRAY) ×4 IMPLANT
SURGILUBE 2OZ TUBE FLIPTOP (MISCELLANEOUS) ×4 IMPLANT
SUT BONE WAX W31G (SUTURE) IMPLANT
SYR 10ML LL (SYRINGE) ×4 IMPLANT
SYR 5ML LL (SYRINGE) ×4 IMPLANT
TOWEL OR 17X26 10 PK STRL BLUE (TOWEL DISPOSABLE) ×4 IMPLANT
TUBE CONNECTING 12X1/4 (SUCTIONS) IMPLANT
UNDERPAD 30X36 HEAVY ABSORB (UNDERPADS AND DIAPERS) ×8 IMPLANT
WATER STERILE IRR 3000ML UROMA (IV SOLUTION) IMPLANT
WATER STERILE IRR 500ML POUR (IV SOLUTION) ×4 IMPLANT

## 2021-01-11 NOTE — Anesthesia Procedure Notes (Signed)
Procedure Name: LMA Insertion Date/Time: 01/11/2021 7:46 AM Performed by: Justice Rocher, CRNA Pre-anesthesia Checklist: Patient identified, Emergency Drugs available, Suction available, Patient being monitored and Timeout performed Patient Re-evaluated:Patient Re-evaluated prior to induction Oxygen Delivery Method: Circle system utilized Preoxygenation: Pre-oxygenation with 100% oxygen Induction Type: IV induction Ventilation: Mask ventilation without difficulty LMA: LMA inserted LMA Size: 5.0 Number of attempts: 1 Airway Equipment and Method: Bite block Placement Confirmation: positive ETCO2,  CO2 detector and breath sounds checked- equal and bilateral Tube secured with: Tape Dental Injury: Teeth and Oropharynx as per pre-operative assessment

## 2021-01-11 NOTE — Progress Notes (Signed)
Glasses found when cleaning up for the day. No valuables listed on preop sheet and denied having any at time of discharge. Message left on Susan's voice stating they had been found and would be available for pick up tomorrow.

## 2021-01-11 NOTE — Transfer of Care (Signed)
Immediate Anesthesia Transfer of Care Note  Patient: Louis Vasquez  Procedure(s) Performed: Procedure(s) (LRB): RADIOACTIVE SEED IMPLANT/BRACHYTHERAPY IMPLANT (N/A) SPACE OAR INSTILLATION (N/A) CYSTOSCOPY (N/A)  Patient Location: PACU  Anesthesia Type: General  Level of Consciousness: awake, sedated, patient cooperative and responds to stimulation  Airway & Oxygen Therapy: Patient Spontanous Breathing and Patient connected to [place] 02 and soft FM  Post-op Assessment: Report given to PACU RN, Post -op Vital signs reviewed and stable and Patient moving all extremities  Post vital signs: Reviewed and stable  Complications: No apparent anesthesia complications

## 2021-01-11 NOTE — Anesthesia Postprocedure Evaluation (Signed)
Anesthesia Post Note  Patient: Louis Vasquez  Procedure(s) Performed: RADIOACTIVE SEED IMPLANT/BRACHYTHERAPY IMPLANT (N/A Prostate) SPACE OAR INSTILLATION (N/A Rectum) CYSTOSCOPY (N/A Bladder)     Patient location during evaluation: PACU Anesthesia Type: General Level of consciousness: awake and alert Pain management: pain level controlled Vital Signs Assessment: post-procedure vital signs reviewed and stable Respiratory status: spontaneous breathing, nonlabored ventilation, respiratory function stable and patient connected to nasal cannula oxygen Cardiovascular status: blood pressure returned to baseline and stable Postop Assessment: no apparent nausea or vomiting Anesthetic complications: no   No complications documented.  Last Vitals:  Vitals:   01/11/21 0930 01/11/21 0945  BP: 122/80 (!) 123/99  Pulse: 88 85  Resp: 15 17  Temp:    SpO2: 96% 96%    Last Pain:  Vitals:   01/11/21 0911  TempSrc:   PainSc: 0-No pain                 Barnet Glasgow

## 2021-01-11 NOTE — Op Note (Signed)
Preoperative diagnosis: Clinically localized adenocarcinoma of the prostate (cT1c Nx Mx)  Postoperative diagnosis: Clinically localized adenocarcinoma of the prostate  Procedure: 1) Transperineal placement of radioactive seeds into the prostate                    2) Cystoscopy                    3) Insertion of SpaceOAR hydrogel   Surgeon: Pryor Curia. M.D.  Radiation oncologist: Dr. Tyler Pita  Anesthesia: General  EBL: Minimal  Complications: None  Indication: Louis Vasquez is a 65 y.o. gentleman with clinically localized prostate cancer. After discussing management options for treatment, he elected to proceed with radiotherapy. He presents today for the above procedures. The potential risks, complications, alternative options, and expected recovery course have been discussed in detail with the patient and he has provided informed consent to proceed.  Description of procedure: The patient was taken to the operating room and general anesthesia was induced. He was administered preoperative antibiotics, placed in the dorsal lithotomy position, and prepped and draped in the usual sterile fashion. Next, intraoperative transrectal ultrasonography was utilized for real-time intraoperative planning by the radiation oncology team. Once the treatment plan was completed and the seed strands created, stranded iodine 125 radiation seeds were placed utilizing a brachytherapy perineal template. 13 radioactive iodine 125 seeds into the prostate through 19 catheter needles.  The brachytherapy template was then removed.  A site in the midline was selected on the perineum for placement of an 18 g needle with saline.  The needle was advanced above the rectum and below Denonvillier's fascia to the mid gland and confirmed to be in the midline on transverse imaging.  One cc of saline was injected confirming appropriate expansion of this space.  A total of 5 cc of saline was then injected to open the  space further bilaterally.  The saline syringe was then removed and the SpaceOAR hydrogel was injected with good distribution bilaterally. Position of the radiation seeds was confirmed on fluoroscopic imaging.  Flexible cystoscopy was then performed and no seeds were identified within the bladder.  No bladder tumors, stones, or other mucosal pathology was identified within the bladder. He tolerated the procedure well and without complications. He was able to be transferred to the recovery unit in satisfactory condition.  He was given a voiding trial in the PACU.

## 2021-01-11 NOTE — Discharge Instructions (Addendum)
You should continue tamsulosin which is a medication to help you urinate.  You should call Dr. Lynne Logan office [phone]) if you feel you cannot empty your bladder well. Also, call if you develop fever > 101.  You will also be prescribed pain medication and should take an over the counter stool softener over the next week to avoid straining with bowel movements.  Followup with Dr. Alinda Money and your radiation oncologist as scheduled.  Radioactive Seed Implant Home Care Instructions   Activity:    Rest for the remainder of the day.  Do not drive or operate equipment today.  You may resume normal activities in a few days as instructed by your physician, without risk of harmful radiation exposure to those around you, provided you follow the time and distance precautions on the Radiation Oncology Instruction Sheet.   Meals: Drink plenty of lipuids and eat light foods, such as gelatin or soup this evening .  You may return to normal meal plan tomorrow.  Return To Work: You may return to work as instructed by [employer].  Special Instruction: If any seeds are found, use tweezers to pick up seeds and place in a glass container of any kind and bring to your physician's office.  Call your physician if any of these symptoms occur:   Persistent or heavy bleeding  Urine stream diminishes or stops completely after catheter is removed  Fever equal to or greater than 101 degrees F  Cloudy urine with a strong foul odor  Severe pain  You may feel some burning pain and/or hesitancy when you urinate after the catheter is removed.  These symptoms may increase over the next few weeks, but should diminish within forur to six weeks.  Applying moist heat to the lower abdomen or a hot tub bath may help relieve the pain.  If the discomfort becomes severe, please call your physician for additional medications.   Post Anesthesia Home Care Instructions  Activity: Get plenty of rest for the remainder of  the day. A responsible individual must stay with you for 24 hours following the procedure.  For the next 24 hours, DO NOT: -Drive a car -Paediatric nurse -Drink alcoholic beverages -Take any medication unless instructed by your physician -Make any legal decisions or sign important papers.  Meals: Start with liquid foods such as gelatin or soup. Progress to regular foods as tolerated. Avoid greasy, spicy, heavy foods. If nausea and/or vomiting occur, drink only clear liquids until the nausea and/or vomiting subsides. Call your physician if vomiting continues.  Special Instructions/Symptoms: Your throat may feel dry or sore from the anesthesia or the breathing tube placed in your throat during surgery. If this causes discomfort, gargle with warm salt water. The discomfort should disappear within 24 hours.

## 2021-01-12 ENCOUNTER — Encounter (HOSPITAL_BASED_OUTPATIENT_CLINIC_OR_DEPARTMENT_OTHER): Payer: Self-pay | Admitting: Urology

## 2021-01-26 DIAGNOSIS — M1611 Unilateral primary osteoarthritis, right hip: Secondary | ICD-10-CM | POA: Diagnosis not present

## 2021-01-26 DIAGNOSIS — M545 Low back pain, unspecified: Secondary | ICD-10-CM | POA: Diagnosis not present

## 2021-01-27 ENCOUNTER — Telehealth: Payer: Self-pay | Admitting: *Deleted

## 2021-01-27 NOTE — Telephone Encounter (Signed)
CALLED PATIENT TO REMIND OF POST SEED APPTS. FOR 01-28-21, LVM FOR A RETURN CALL

## 2021-01-28 ENCOUNTER — Other Ambulatory Visit: Payer: Self-pay

## 2021-01-28 ENCOUNTER — Encounter: Payer: Self-pay | Admitting: Medical Oncology

## 2021-01-28 ENCOUNTER — Ambulatory Visit
Admission: RE | Admit: 2021-01-28 | Discharge: 2021-01-28 | Disposition: A | Discharge status: Home / Self Care | Payer: BC Managed Care – PPO | Source: Ambulatory Visit | Attending: Radiation Oncology | Admitting: Radiation Oncology

## 2021-01-28 ENCOUNTER — Ambulatory Visit
Admission: RE | Admit: 2021-01-28 | Discharge: 2021-01-28 | Disposition: A | Discharge status: Home / Self Care | Payer: BC Managed Care – PPO | Source: Ambulatory Visit | Attending: Urology | Admitting: Urology

## 2021-01-28 ENCOUNTER — Encounter: Payer: Self-pay | Admitting: Urology

## 2021-01-28 VITALS — BP 158/88 | HR 90 | Temp 98.8°F | Resp 20 | Wt 277.5 lb

## 2021-01-28 DIAGNOSIS — Z923 Personal history of irradiation: Secondary | ICD-10-CM | POA: Insufficient documentation

## 2021-01-28 DIAGNOSIS — C61 Malignant neoplasm of prostate: Secondary | ICD-10-CM

## 2021-01-28 DIAGNOSIS — R350 Frequency of micturition: Secondary | ICD-10-CM | POA: Insufficient documentation

## 2021-01-28 DIAGNOSIS — R3911 Hesitancy of micturition: Secondary | ICD-10-CM | POA: Insufficient documentation

## 2021-01-28 DIAGNOSIS — Z510 Encounter for antineoplastic radiation therapy: Secondary | ICD-10-CM | POA: Diagnosis not present

## 2021-01-28 DIAGNOSIS — Z79899 Other long term (current) drug therapy: Secondary | ICD-10-CM | POA: Diagnosis not present

## 2021-01-28 NOTE — Progress Notes (Signed)
Radiation Oncology         [phone] ________________________________  Name: Louis Vasquez MRN: [id number]  Date: 01/28/2021  DOB: 21-Apr-1956  Post-Seed Follow-Up Visit Note  CC: System, Provider Not In  Raynelle Bring, MD  Diagnosis:   65 y.o. gentleman with Stage T1c adenocarcinoma of the prostate with Gleason score of 3+4, and PSA of 6.7.    ICD-10-CM   1. Malignant neoplasm of prostate ([facility])  C61     Interval Since Last Radiation:  2.5 weeks 01/11/21:  Insertion of radioactive I-125 seeds into the prostate gland; 145 Gy, definitive therapy with placement of SpaceOAR VUE gel.  Narrative:  The patient returns today for routine follow-up.  He is complaining of increased urinary frequency and urinary hesitation symptoms. He filled out a questionnaire regarding urinary function today providing and overall IPSS score of 14 characterizing his symptoms as moderate with weakened flow of stream, intermittency, frequency and urgency but gradually improving.  His pre-implant score was 9. He denies any abdominal pain or bowel symptoms.  ALLERGIES:  has No Known Allergies.  Meds: Current Outpatient Medications  Medication Sig Dispense Refill  . amLODipine-olmesartan (AZOR) 5-40 MG tablet Take 1 tablet by mouth daily.    . celecoxib (CELEBREX) 200 MG capsule 1 tablet daily with food for pain and swelling. (Patient taking differently: every 4 (four) hours as needed. 1 tablet daily with food for pain and swelling.) 30 capsule 0  . docusate sodium (COLACE) 100 MG capsule Take 1 capsule (100 mg total) by mouth 2 (two) times daily. 30 capsule 0  . metoprolol succinate (TOPROL-XL) 50 MG 24 hr tablet Take 50 mg by mouth daily. Take with or immediately following a meal.    . tamsulosin (FLOMAX) 0.4 MG CAPS capsule Take 0.4 mg by mouth daily.    . traMADol (ULTRAM) 50 MG tablet Take 1-2 tablets (50-100 mg total) by mouth every 6 (six) hours as needed (pain). 15 tablet 0   No current  facility-administered medications for this visit.    Physical Findings: In general this is a well appearing Caucasian male in no acute distress. He's alert and oriented x4 and appropriate throughout the examination. Cardiopulmonary assessment is negative for acute distress and he exhibits normal effort.   Lab Findings: Lab Results  Component Value Date   WBC 7.1 01/07/2021   HGB 16.5 01/07/2021   HCT 47.8 01/07/2021   MCV 95.2 01/07/2021   PLT 166 01/07/2021    Radiographic Findings:  Patient underwent CT imaging in our clinic for post implant dosimetry. The CT will be reviewed by Dr. Tammi Klippel to confirm there is an adequate distribution of radioactive seeds throughout the prostate gland and ensure that there are no seeds in or near the rectum. We suspect the final radiation plan and dosimetry will show appropriate coverage of the prostate gland. He understands that we will call and inform him of any unexpected findings on further review of his imaging and dosimetry.  Impression/Plan: 65 y.o. gentleman with Stage T1c adenocarcinoma of the prostate with Gleason score of 3+4, and PSA of 6.7. The patient is recovering from the effects of radiation. His urinary symptoms should gradually improve over the next 4-6 months. We talked about this today. He is encouraged by his improvement already and is otherwise pleased with his outcome. We also talked about long-term follow-up for prostate cancer following seed implant. He understands that ongoing PSA determinations and digital rectal exams will help perform surveillance to rule out disease  recurrence. He has a follow up appointment scheduled with Alinda Money on 02/16/21. He understands what to expect with his PSA measures. Patient was also educated today about some of the long-term effects from radiation including a small risk for rectal bleeding and possibly erectile dysfunction. We talked about some of the general management approaches to these potential  complications. However, I did encourage the patient to contact our office or return at any point if he has questions or concerns related to his previous radiation and prostate cancer.    Nicholos Johns, PA-C

## 2021-01-28 NOTE — Progress Notes (Signed)
  Radiation Oncology         [phone] ________________________________  Name: Louis Vasquez MRN: [id number]  Date: 01/28/2021  DOB: 1956/07/17  COMPLEX SIMULATION NOTE  NARRATIVE:  The patient was brought to the [facility] today following prostate seed implantation approximately one month ago.  Identity was confirmed.  All relevant records and images related to the planned course of therapy were reviewed.  Then, the patient was set-up supine.  CT images were obtained.  The CT images were loaded into the planning software.  Then the prostate and rectum were contoured.  Treatment planning then occurred.  The implanted iodine 125 seeds were identified by the physics staff for projection of radiation distribution  I have requested : 3D Simulation  I have requested a DVH of the following structures: Prostate and rectum.    ________________________________  Sheral Apley Tammi Klippel, M.D.

## 2021-01-28 NOTE — Progress Notes (Signed)
Patient states that he has an appointment with [facility] Next Tuesday. Patient denies any dysuria or hematuria Patient reports some leakage and urgency with urination. Patient reports a  weak stream. Patient denies any issues with his bowels.Patient denies any fatigue. Patient reports nocturia x1.Patient states that he empties his bladder with urination.   Vitals:   01/28/21 1316  BP: (!) 158/88  Pulse: 90  Resp: 20  Temp: 98.8 F (37.1 C)  SpO2: 98%  Weight: 125.9 kg

## 2021-02-11 DIAGNOSIS — M48061 Spinal stenosis, lumbar region without neurogenic claudication: Secondary | ICD-10-CM | POA: Diagnosis not present

## 2021-02-11 DIAGNOSIS — M47816 Spondylosis without myelopathy or radiculopathy, lumbar region: Secondary | ICD-10-CM | POA: Diagnosis not present

## 2021-02-11 DIAGNOSIS — Z8546 Personal history of malignant neoplasm of prostate: Secondary | ICD-10-CM | POA: Diagnosis not present

## 2021-02-16 DIAGNOSIS — M1611 Unilateral primary osteoarthritis, right hip: Secondary | ICD-10-CM | POA: Diagnosis not present

## 2021-02-16 DIAGNOSIS — C61 Malignant neoplasm of prostate: Secondary | ICD-10-CM | POA: Diagnosis not present

## 2021-02-22 ENCOUNTER — Encounter: Payer: Self-pay | Admitting: Radiation Oncology

## 2021-02-22 ENCOUNTER — Ambulatory Visit
Admission: RE | Admit: 2021-02-22 | Discharge: 2021-02-22 | Disposition: A | Discharge status: Home / Self Care | Payer: BC Managed Care – PPO | Source: Ambulatory Visit | Attending: Radiation Oncology | Admitting: Radiation Oncology

## 2021-02-22 DIAGNOSIS — C61 Malignant neoplasm of prostate: Secondary | ICD-10-CM | POA: Insufficient documentation

## 2021-02-22 DIAGNOSIS — Z510 Encounter for antineoplastic radiation therapy: Secondary | ICD-10-CM | POA: Insufficient documentation

## 2021-02-23 NOTE — Progress Notes (Signed)
  Radiation Oncology         [phone] ________________________________  Name: Louis Vasquez MRN: [id number]  Date: 02/22/2021  DOB: 07-17-1956  3D Planning Note   Prostate Brachytherapy Post-Implant Dosimetry  Diagnosis: 65 y.o. gentleman with Stage T1c adenocarcinoma of the prostate with Gleason score of 3+4, and PSA of 6.7.  Narrative: On a previous date, Louis Vasquez returned following prostate seed implantation for post implant planning. He underwent CT scan complex simulation to delineate the three-dimensional structures of the pelvis and demonstrate the radiation distribution.  Since that time, the seed localization, and complex isodose planning with dose volume histograms have now been completed.  Results:   Prostate Coverage - The dose of radiation delivered to the 90% or more of the prostate gland (D90) was 94.11% of the prescription dose. This exceeds our goal of greater than 90%. Rectal Sparing - The volume of rectal tissue receiving the prescription dose or higher was 0.0 cc. This falls under our thresholds tolerance of 1.0 cc.  Impression: The prostate seed implant appears to show adequate target coverage and appropriate rectal sparing.  Plan:  The patient will continue to follow with urology for ongoing PSA determinations. I would anticipate a high likelihood for local tumor control with minimal risk for rectal morbidity.  ________________________________  Sheral Apley Tammi Klippel, M.D.

## 2021-04-05 DIAGNOSIS — M24851 Other specific joint derangements of right hip, not elsewhere classified: Secondary | ICD-10-CM | POA: Diagnosis not present

## 2021-04-05 DIAGNOSIS — M1611 Unilateral primary osteoarthritis, right hip: Secondary | ICD-10-CM | POA: Diagnosis not present

## 2021-04-05 DIAGNOSIS — M25451 Effusion, right hip: Secondary | ICD-10-CM | POA: Diagnosis not present

## 2021-04-05 DIAGNOSIS — M7061 Trochanteric bursitis, right hip: Secondary | ICD-10-CM | POA: Diagnosis not present

## 2021-04-15 DIAGNOSIS — M1611 Unilateral primary osteoarthritis, right hip: Secondary | ICD-10-CM | POA: Diagnosis not present

## 2021-04-19 DIAGNOSIS — M1611 Unilateral primary osteoarthritis, right hip: Secondary | ICD-10-CM | POA: Diagnosis not present

## 2021-04-22 IMAGING — DX DG CHEST 2V
2 series · 2 of 2 positions shown · non-contrast
Comparison: 11/26/2012

CLINICAL DATA: Hypertension and prostate carcinoma

EXAM:
CHEST - 2 VIEW

[dg chest 2 view (1 of 2)]
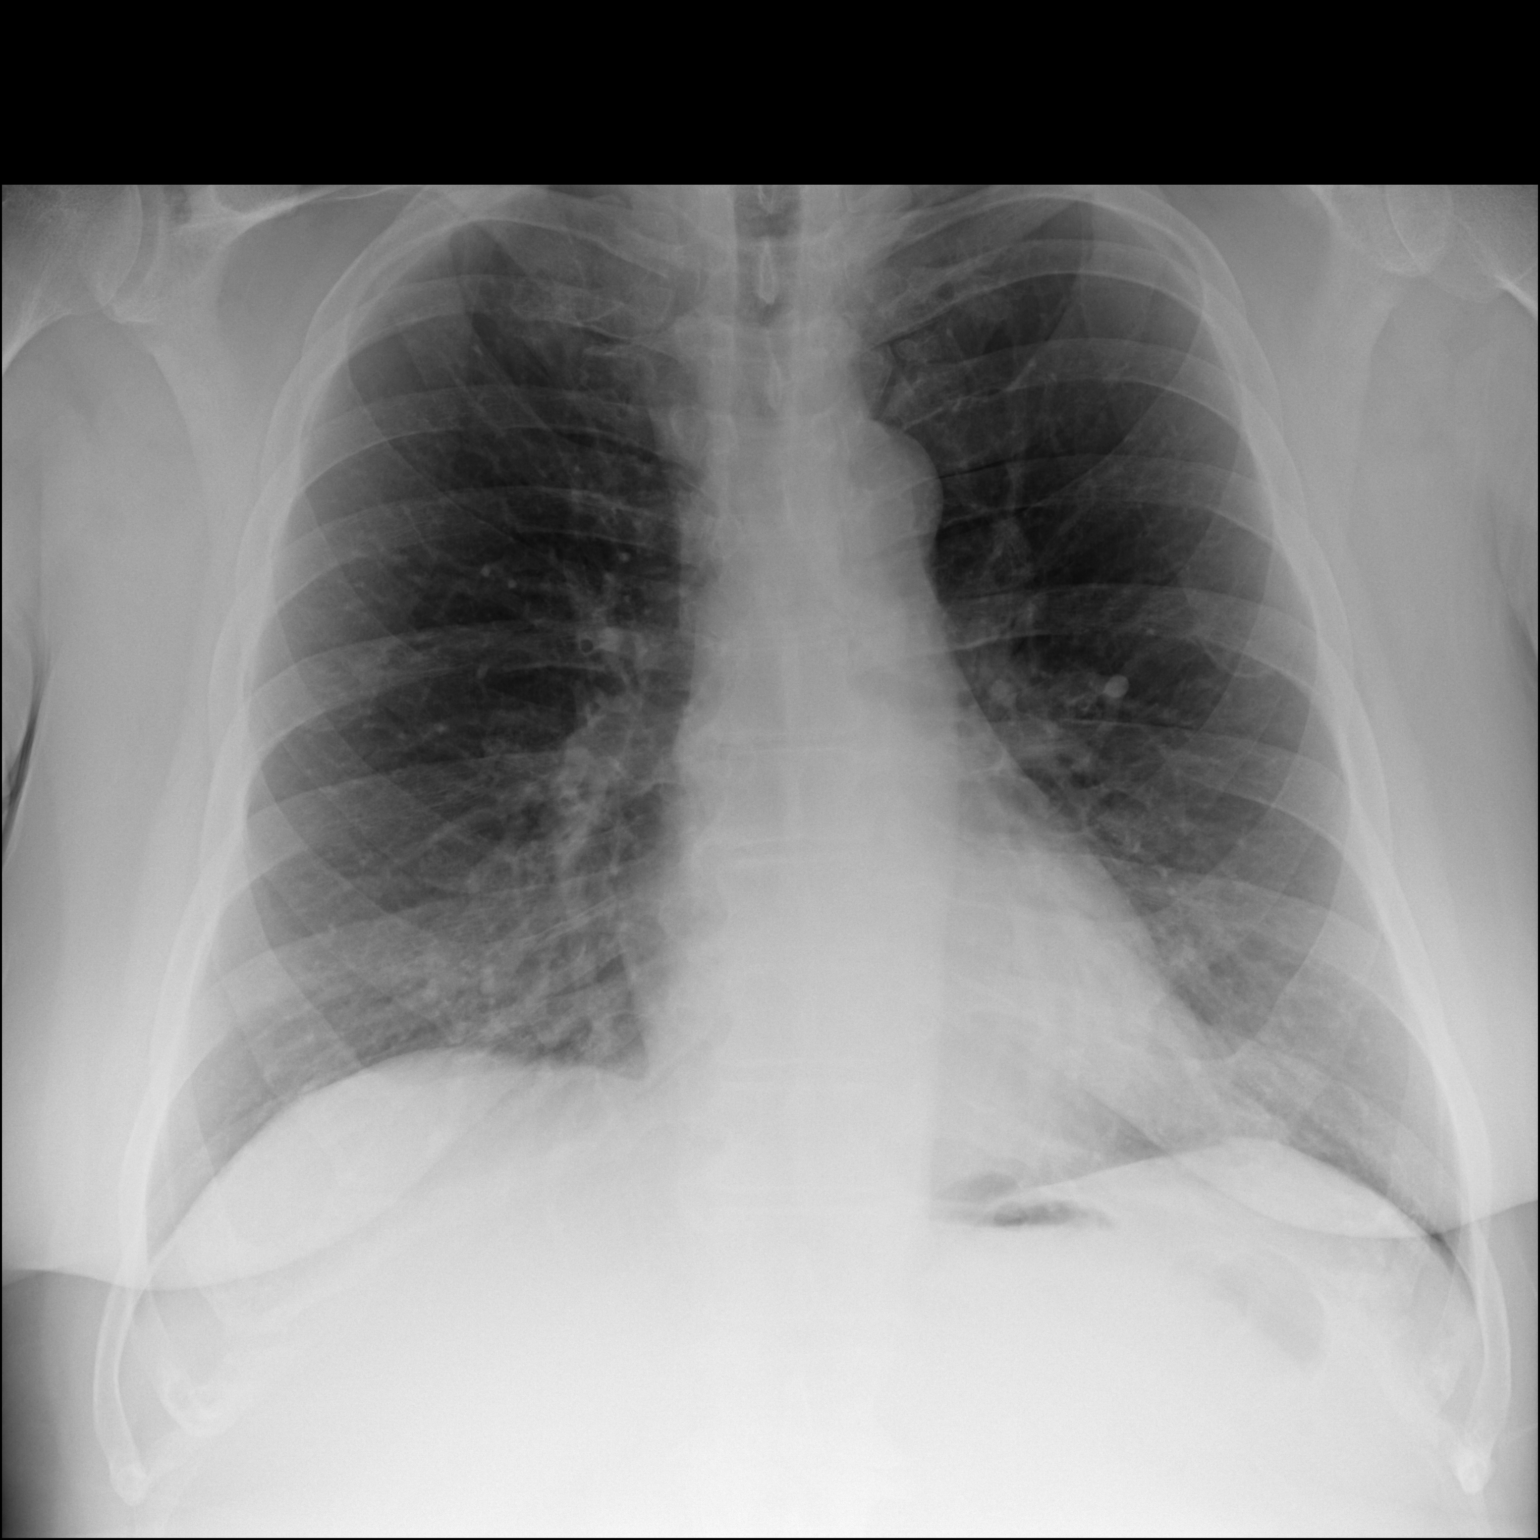

[dg chest 2 view (2 of 2)]
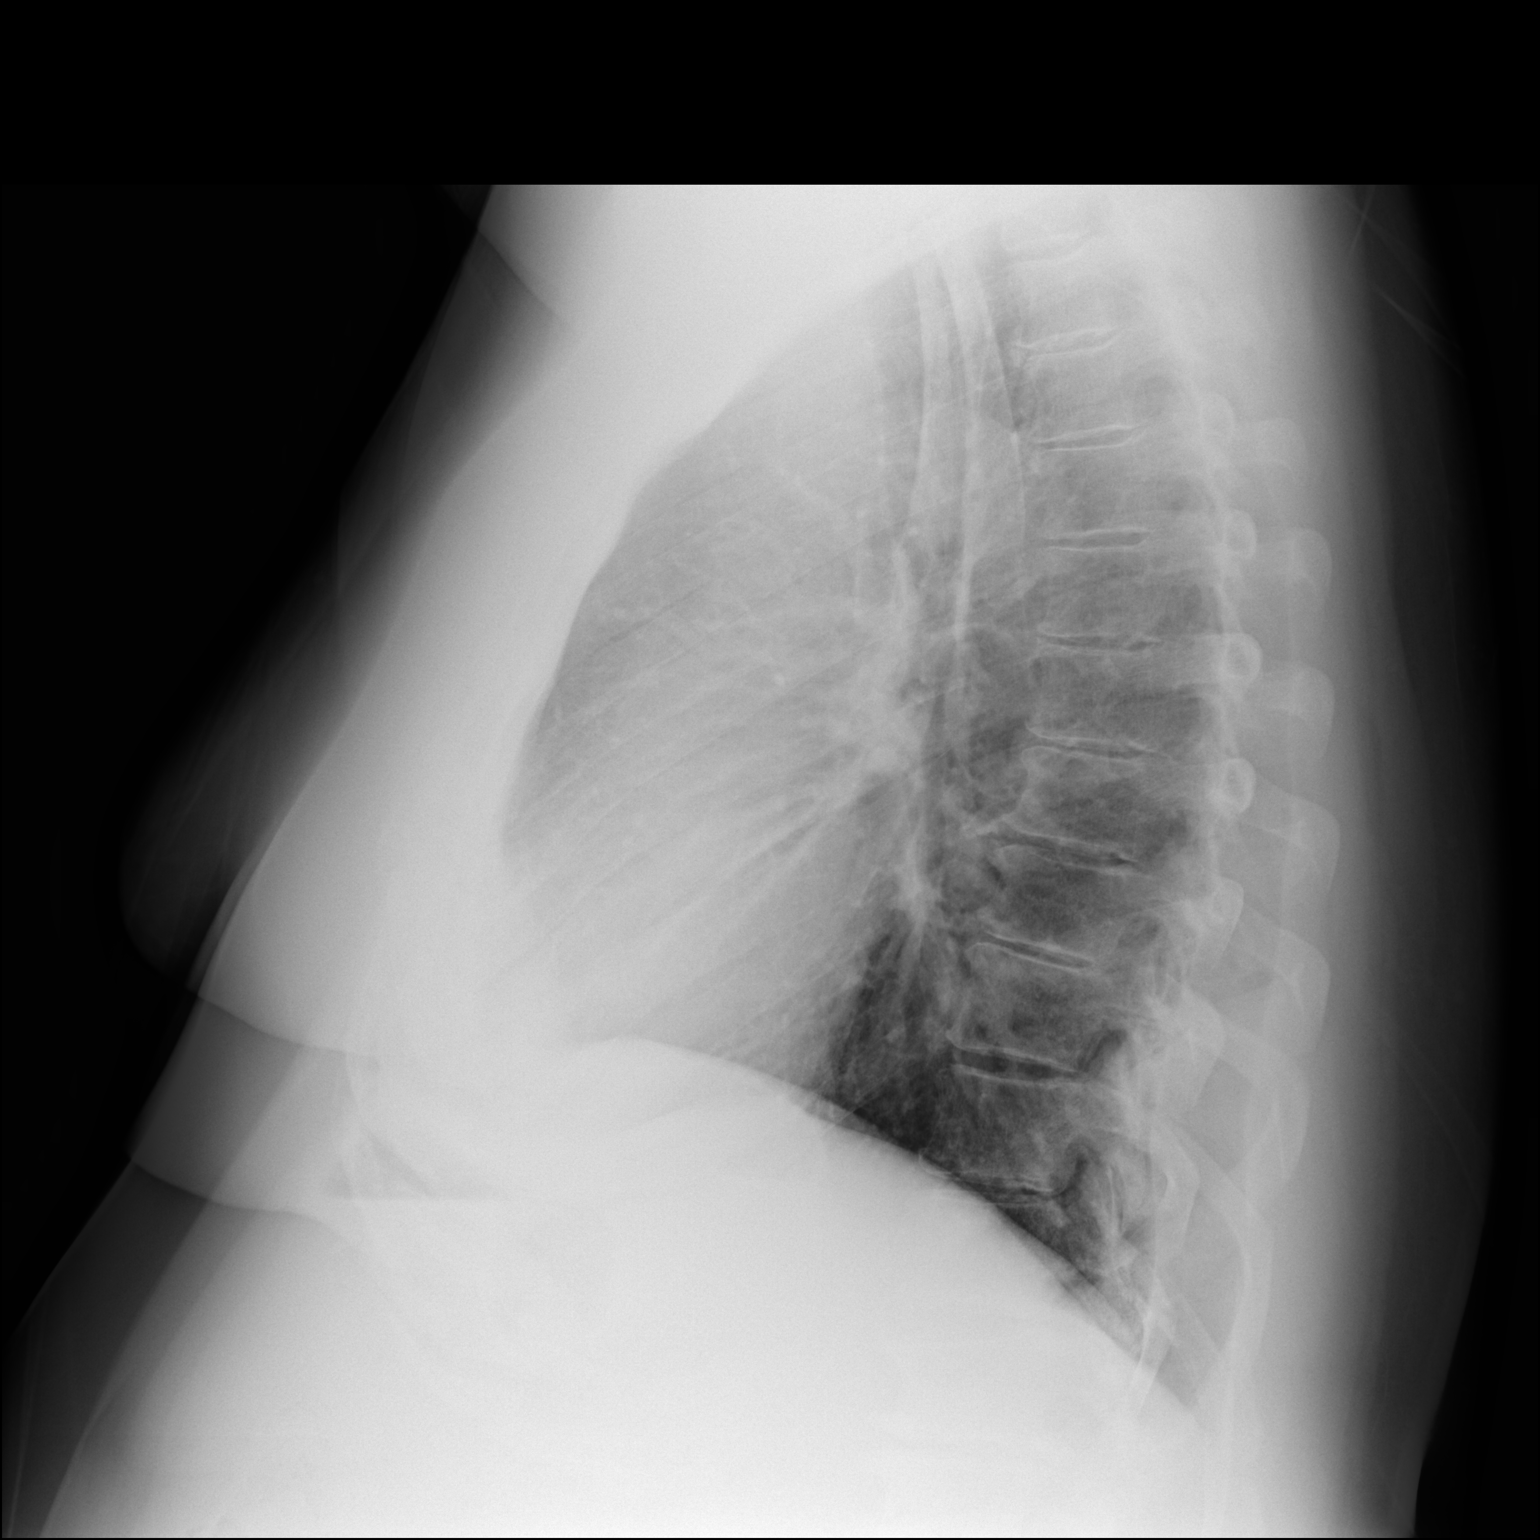

[2 of 2 positions shown; findings below may reference images not displayed]

FINDINGS: The heart size and mediastinal contours are within normal limits.
Both lungs are clear. The visualized skeletal structures are
unremarkable.
IMPRESSION: No active cardiopulmonary disease.

## 2021-05-13 DIAGNOSIS — Z131 Encounter for screening for diabetes mellitus: Secondary | ICD-10-CM | POA: Diagnosis not present

## 2021-05-13 DIAGNOSIS — M25551 Pain in right hip: Secondary | ICD-10-CM | POA: Diagnosis not present

## 2021-05-13 DIAGNOSIS — M1611 Unilateral primary osteoarthritis, right hip: Secondary | ICD-10-CM | POA: Diagnosis not present

## 2021-05-13 DIAGNOSIS — E782 Mixed hyperlipidemia: Secondary | ICD-10-CM | POA: Diagnosis not present

## 2021-05-13 DIAGNOSIS — I10 Essential (primary) hypertension: Secondary | ICD-10-CM | POA: Diagnosis not present

## 2021-05-13 DIAGNOSIS — Z01818 Encounter for other preprocedural examination: Secondary | ICD-10-CM | POA: Diagnosis not present

## 2021-05-28 DIAGNOSIS — M1611 Unilateral primary osteoarthritis, right hip: Secondary | ICD-10-CM | POA: Diagnosis not present

## 2021-06-23 DIAGNOSIS — N401 Enlarged prostate with lower urinary tract symptoms: Secondary | ICD-10-CM | POA: Diagnosis not present

## 2021-06-23 DIAGNOSIS — R3915 Urgency of urination: Secondary | ICD-10-CM | POA: Diagnosis not present

## 2021-06-29 DIAGNOSIS — N401 Enlarged prostate with lower urinary tract symptoms: Secondary | ICD-10-CM | POA: Diagnosis not present

## 2021-06-29 DIAGNOSIS — C61 Malignant neoplasm of prostate: Secondary | ICD-10-CM | POA: Diagnosis not present

## 2021-06-29 DIAGNOSIS — R3915 Urgency of urination: Secondary | ICD-10-CM | POA: Diagnosis not present

## 2022-01-26 DIAGNOSIS — C61 Malignant neoplasm of prostate: Secondary | ICD-10-CM | POA: Diagnosis not present

## 2022-02-02 DIAGNOSIS — C61 Malignant neoplasm of prostate: Secondary | ICD-10-CM | POA: Diagnosis not present

## 2022-02-17 DIAGNOSIS — H18513 Endothelial corneal dystrophy, bilateral: Secondary | ICD-10-CM | POA: Diagnosis not present

## 2022-04-28 DIAGNOSIS — H18513 Endothelial corneal dystrophy, bilateral: Secondary | ICD-10-CM | POA: Diagnosis not present

## 2022-05-13 DIAGNOSIS — H18513 Endothelial corneal dystrophy, bilateral: Secondary | ICD-10-CM | POA: Diagnosis not present

## 2022-05-13 DIAGNOSIS — H5203 Hypermetropia, bilateral: Secondary | ICD-10-CM | POA: Diagnosis not present

## 2022-05-13 DIAGNOSIS — H2513 Age-related nuclear cataract, bilateral: Secondary | ICD-10-CM | POA: Diagnosis not present

## 2022-07-05 DIAGNOSIS — Z947 Corneal transplant status: Secondary | ICD-10-CM | POA: Diagnosis not present

## 2022-07-05 DIAGNOSIS — H18613 Keratoconus, stable, bilateral: Secondary | ICD-10-CM | POA: Diagnosis not present

## 2022-08-31 DIAGNOSIS — C61 Malignant neoplasm of prostate: Secondary | ICD-10-CM | POA: Diagnosis not present

## 2022-08-31 DIAGNOSIS — R109 Unspecified abdominal pain: Secondary | ICD-10-CM | POA: Diagnosis not present

## 2022-08-31 DIAGNOSIS — Z0001 Encounter for general adult medical examination with abnormal findings: Secondary | ICD-10-CM | POA: Diagnosis not present

## 2022-08-31 DIAGNOSIS — I10 Essential (primary) hypertension: Secondary | ICD-10-CM | POA: Diagnosis not present

## 2022-08-31 DIAGNOSIS — E782 Mixed hyperlipidemia: Secondary | ICD-10-CM | POA: Diagnosis not present

## 2022-08-31 DIAGNOSIS — E559 Vitamin D deficiency, unspecified: Secondary | ICD-10-CM | POA: Diagnosis not present

## 2022-08-31 DIAGNOSIS — R32 Unspecified urinary incontinence: Secondary | ICD-10-CM | POA: Diagnosis not present

## 2022-11-14 DIAGNOSIS — H18613 Keratoconus, stable, bilateral: Secondary | ICD-10-CM | POA: Diagnosis not present

## 2023-04-13 DIAGNOSIS — M25521 Pain in right elbow: Secondary | ICD-10-CM | POA: Diagnosis not present

## 2023-04-27 DIAGNOSIS — M7711 Lateral epicondylitis, right elbow: Secondary | ICD-10-CM | POA: Diagnosis not present

## 2023-05-02 DIAGNOSIS — M25521 Pain in right elbow: Secondary | ICD-10-CM | POA: Diagnosis not present

## 2023-05-03 DIAGNOSIS — M7711 Lateral epicondylitis, right elbow: Secondary | ICD-10-CM | POA: Diagnosis not present

## 2023-05-09 DIAGNOSIS — M7711 Lateral epicondylitis, right elbow: Secondary | ICD-10-CM | POA: Diagnosis not present

## 2023-08-08 DIAGNOSIS — T1500XA Foreign body in cornea, unspecified eye, initial encounter: Secondary | ICD-10-CM | POA: Diagnosis not present

## 2023-08-08 DIAGNOSIS — H5712 Ocular pain, left eye: Secondary | ICD-10-CM | POA: Diagnosis not present

## 2023-08-08 DIAGNOSIS — T1502XA Foreign body in cornea, left eye, initial encounter: Secondary | ICD-10-CM | POA: Diagnosis not present

## 2023-08-08 DIAGNOSIS — S0502XA Injury of conjunctiva and corneal abrasion without foreign body, left eye, initial encounter: Secondary | ICD-10-CM | POA: Diagnosis not present

## 2023-08-08 DIAGNOSIS — H18513 Endothelial corneal dystrophy, bilateral: Secondary | ICD-10-CM | POA: Diagnosis not present

## 2023-08-09 DIAGNOSIS — H5712 Ocular pain, left eye: Secondary | ICD-10-CM | POA: Diagnosis not present

## 2023-08-09 DIAGNOSIS — S0502XD Injury of conjunctiva and corneal abrasion without foreign body, left eye, subsequent encounter: Secondary | ICD-10-CM | POA: Diagnosis not present

## 2023-08-09 DIAGNOSIS — H18513 Endothelial corneal dystrophy, bilateral: Secondary | ICD-10-CM | POA: Diagnosis not present

## 2023-08-17 DIAGNOSIS — H16403 Unspecified corneal neovascularization, bilateral: Secondary | ICD-10-CM | POA: Diagnosis not present

## 2023-08-17 DIAGNOSIS — H18513 Endothelial corneal dystrophy, bilateral: Secondary | ICD-10-CM | POA: Diagnosis not present

## 2023-08-17 DIAGNOSIS — S0502XD Injury of conjunctiva and corneal abrasion without foreign body, left eye, subsequent encounter: Secondary | ICD-10-CM | POA: Diagnosis not present

## 2023-08-17 DIAGNOSIS — H5712 Ocular pain, left eye: Secondary | ICD-10-CM | POA: Diagnosis not present

## 2023-09-06 DIAGNOSIS — I10 Essential (primary) hypertension: Secondary | ICD-10-CM | POA: Diagnosis not present

## 2023-09-06 DIAGNOSIS — Z79899 Other long term (current) drug therapy: Secondary | ICD-10-CM | POA: Diagnosis not present

## 2023-09-06 DIAGNOSIS — E559 Vitamin D deficiency, unspecified: Secondary | ICD-10-CM | POA: Diagnosis not present

## 2023-09-06 DIAGNOSIS — Z8739 Personal history of other diseases of the musculoskeletal system and connective tissue: Secondary | ICD-10-CM | POA: Diagnosis not present

## 2023-09-06 DIAGNOSIS — C61 Malignant neoplasm of prostate: Secondary | ICD-10-CM | POA: Diagnosis not present

## 2023-09-06 DIAGNOSIS — Z0000 Encounter for general adult medical examination without abnormal findings: Secondary | ICD-10-CM | POA: Diagnosis not present

## 2023-09-06 DIAGNOSIS — Z23 Encounter for immunization: Secondary | ICD-10-CM | POA: Diagnosis not present

## 2023-09-06 DIAGNOSIS — D696 Thrombocytopenia, unspecified: Secondary | ICD-10-CM | POA: Diagnosis not present

## 2023-09-06 DIAGNOSIS — E782 Mixed hyperlipidemia: Secondary | ICD-10-CM | POA: Diagnosis not present

## 2023-09-06 DIAGNOSIS — D7589 Other specified diseases of blood and blood-forming organs: Secondary | ICD-10-CM | POA: Diagnosis not present

## 2023-09-27 DIAGNOSIS — I10 Essential (primary) hypertension: Secondary | ICD-10-CM | POA: Diagnosis not present

## 2023-11-30 DIAGNOSIS — M65311 Trigger thumb, right thumb: Secondary | ICD-10-CM | POA: Diagnosis not present

## 2023-11-30 DIAGNOSIS — M13841 Other specified arthritis, right hand: Secondary | ICD-10-CM | POA: Diagnosis not present
